# Patient Record
Sex: Female | Born: 1950 | Race: White | Hispanic: No | Marital: Married
Health system: Midwestern US, Academic
[De-identification: ages and names within clinical notes are randomized; demographics above are authoritative.]

---

## 2015-01-20 IMAGING — MG MAMMOGRAM 3D SCREEN BILATERAL
8 series · 8 of 8 positions shown · non-contrast
Comparison: [DATE] [DATE], [DATE], [DATE] [DATE], [DATE].
Left breast sonogram April 12, 2012.

DIGITAL MAMMOGRAM SCREENING
INDICATION: Screening.
TECHNIQUE: Four standard projections with CAD.
Four standard projections with tomosynthesis.

[R CC]
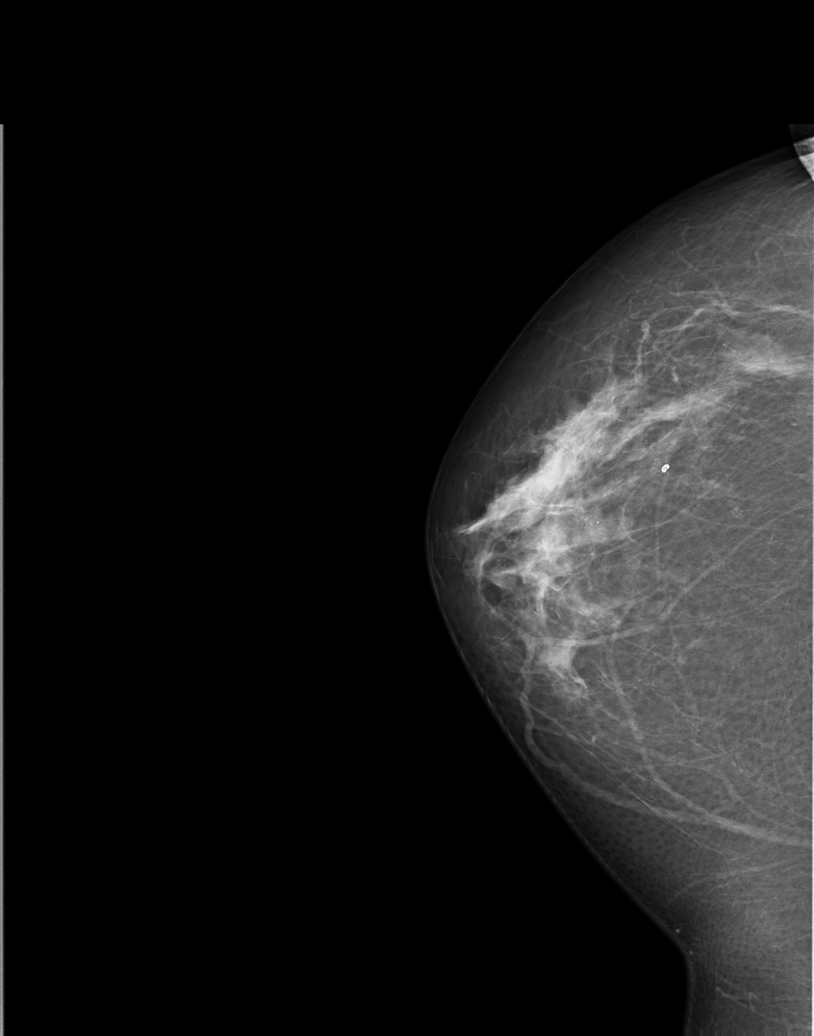

[R tomo (1 of 2)]
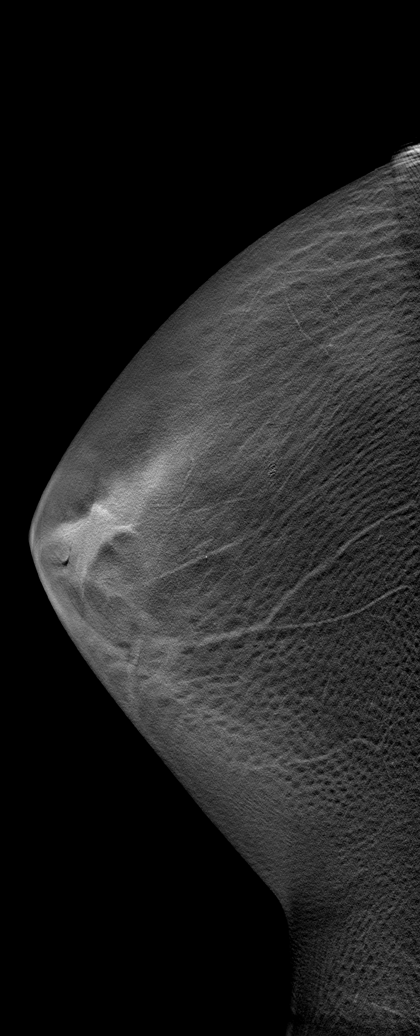

[L CC]
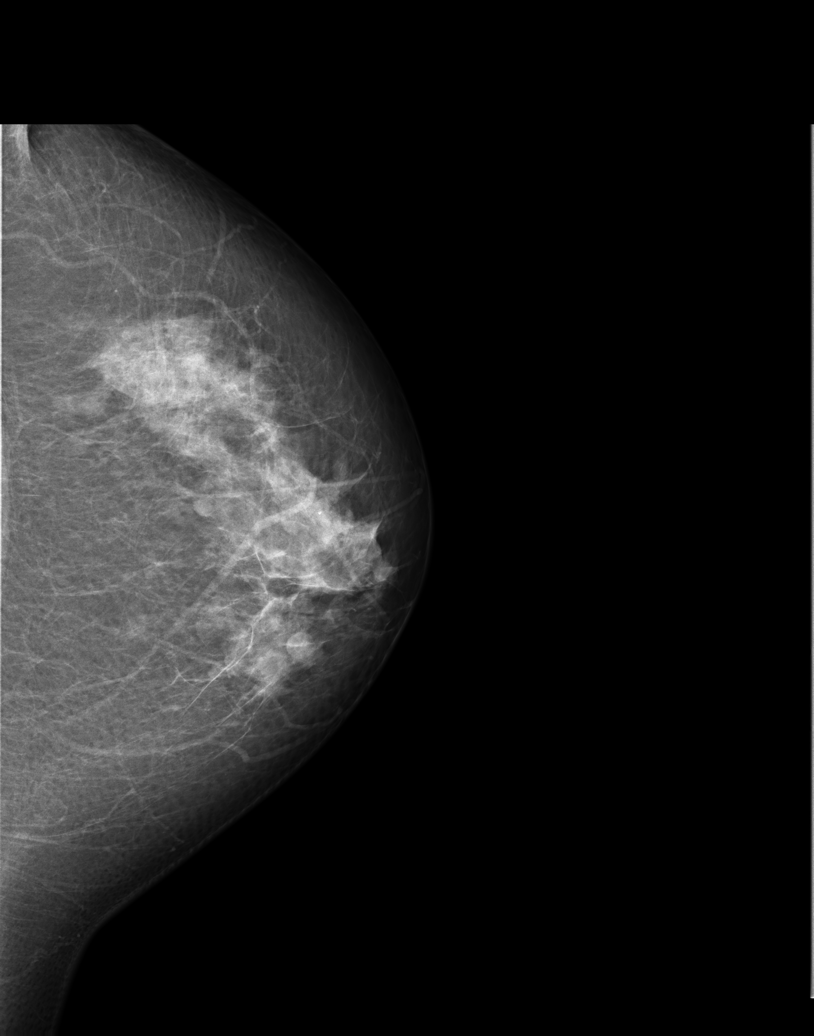

[L tomo (1 of 2)]
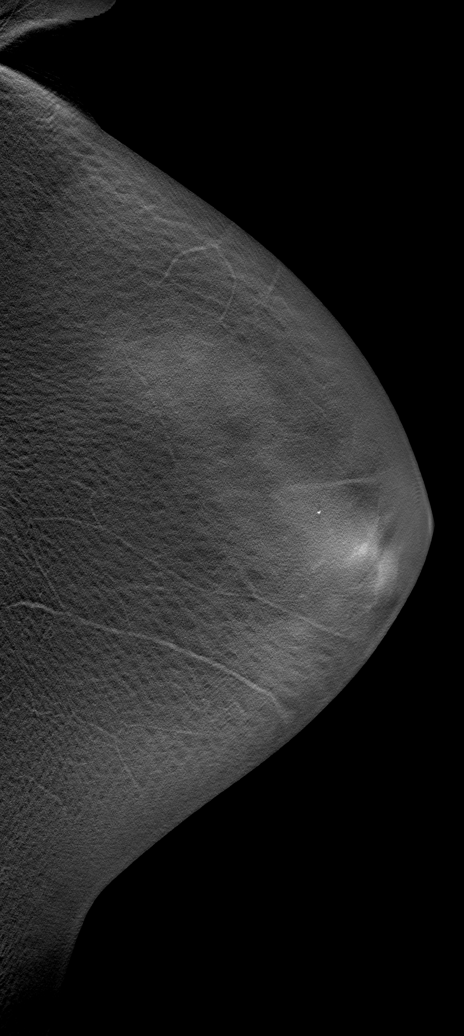

[R MLO]
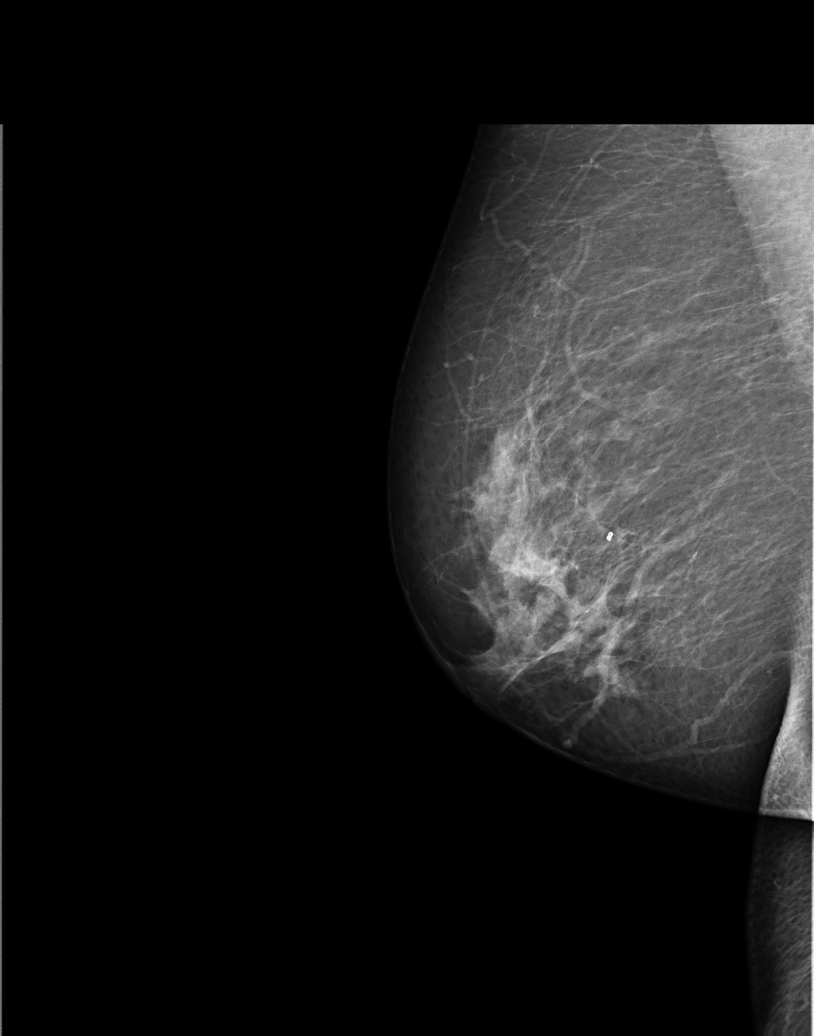

[R tomo (2 of 2)]
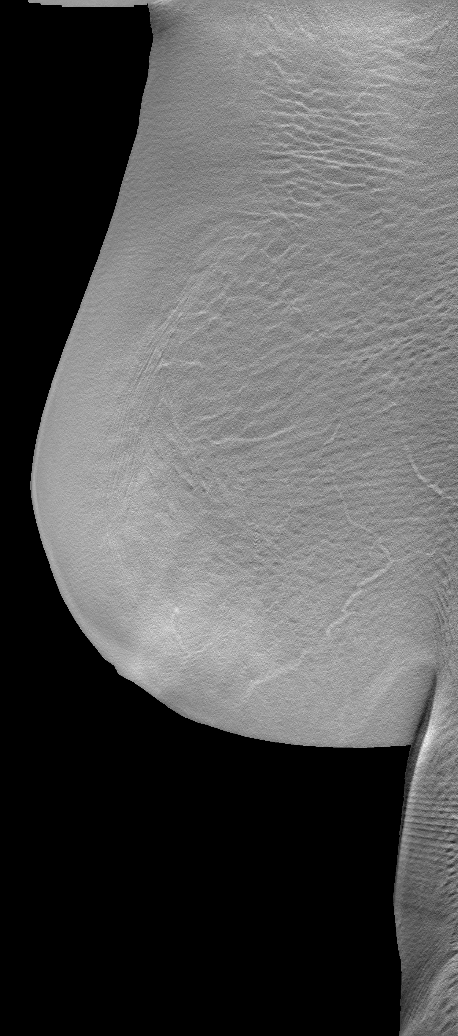

[L MLO]
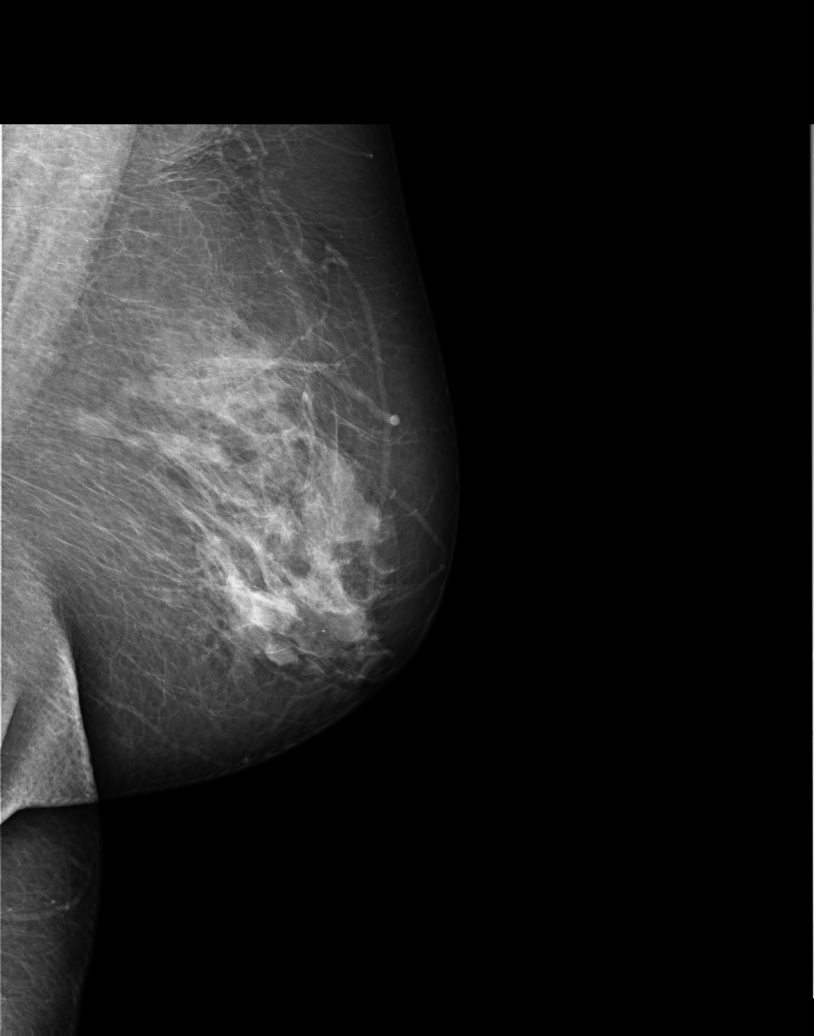

[L tomo (2 of 2)]
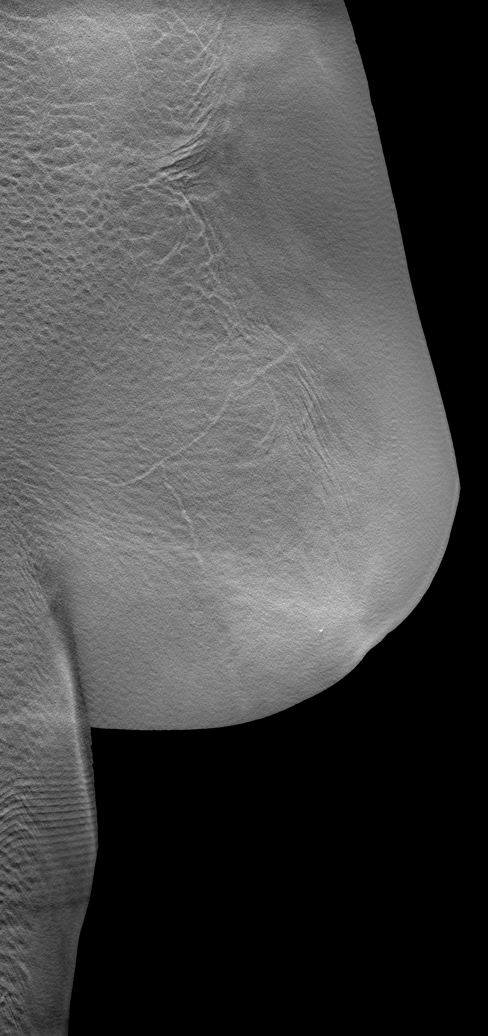

[8 of 8 positions shown; findings below may reference images not displayed]

FINDINGS: There is a stable underlying scattered fibroglandular pattern.
There are multiple well-circumscribed mass densities of the lower inner
quadrant of the left breast at anterior depth which for the most part are the
same or smaller than on the previous exams.
No new, dominant, nor suspicious parenchymal asymmetry, architectural
distortion or calcifications seen.
IMPRESSION: Left breast:  BIRADS 0:  Incomplete:  Need additional imaging evaluation.
Sonographic assessment is recommended.
The absence of a significant mammographic finding should not delay biopsy of
any clinically suspicious lesion.

Right breast:  BIRADS 1:  Negative mammogram.
Annual mammographic follow up is recommended.
Regular breast self-examination is encouraged.

Overall BIRADS category for this exam:  BIRADS 0:  Incomplete:  Need
additional imaging evaluation.

Exam discussed with Manha Achol at [DATE] a.m.

BI-RADS:  0-INCOMPLETE:  NEEDS ADDITIONAL IMAGING EVALUATION

FOLLOWUP:  IMMEDIATE FOLLOWUP

Tech Notes: SCREENING. HX RT CYST ASPIRATION. 2 MATERANL AUNTS WITH BREAST CANCER. LM

## 2017-01-11 IMAGING — CR CHEST
2 series · 2 of 2 positions shown · non-contrast
Comparison: none

[chest pa x-wise]
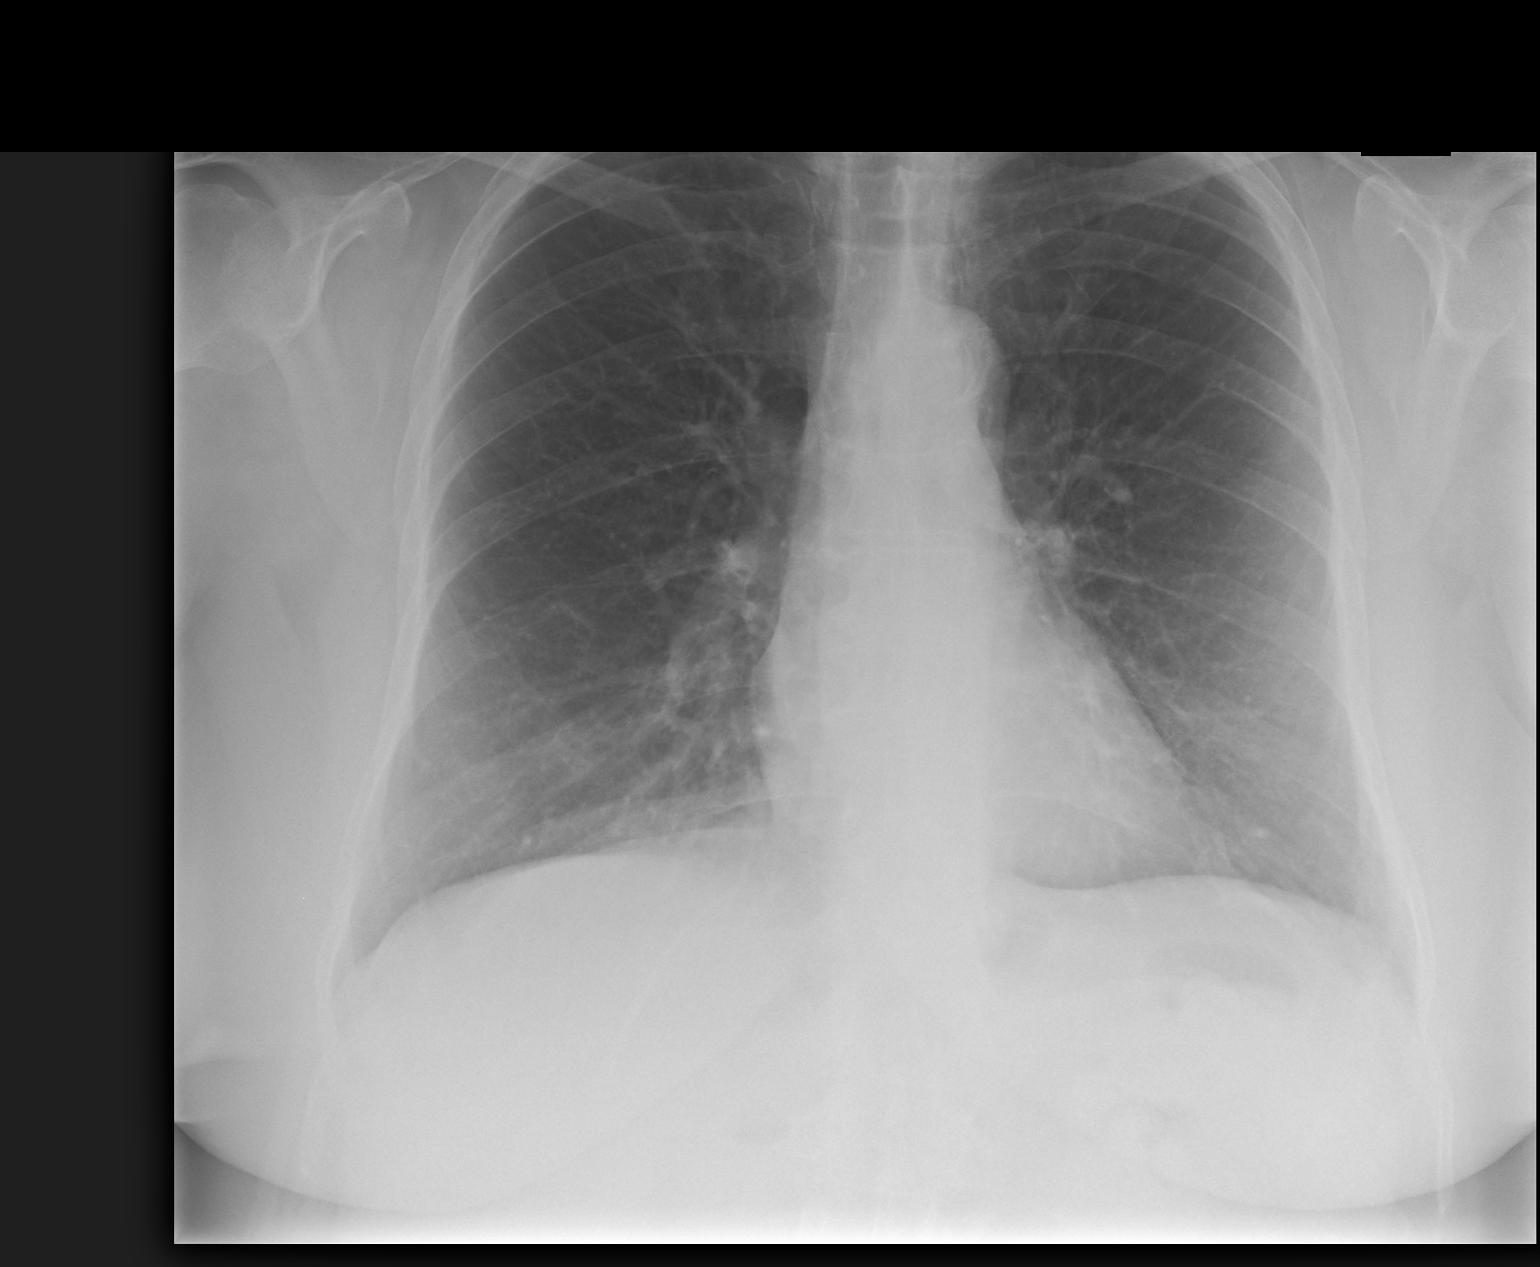

[chest lat]
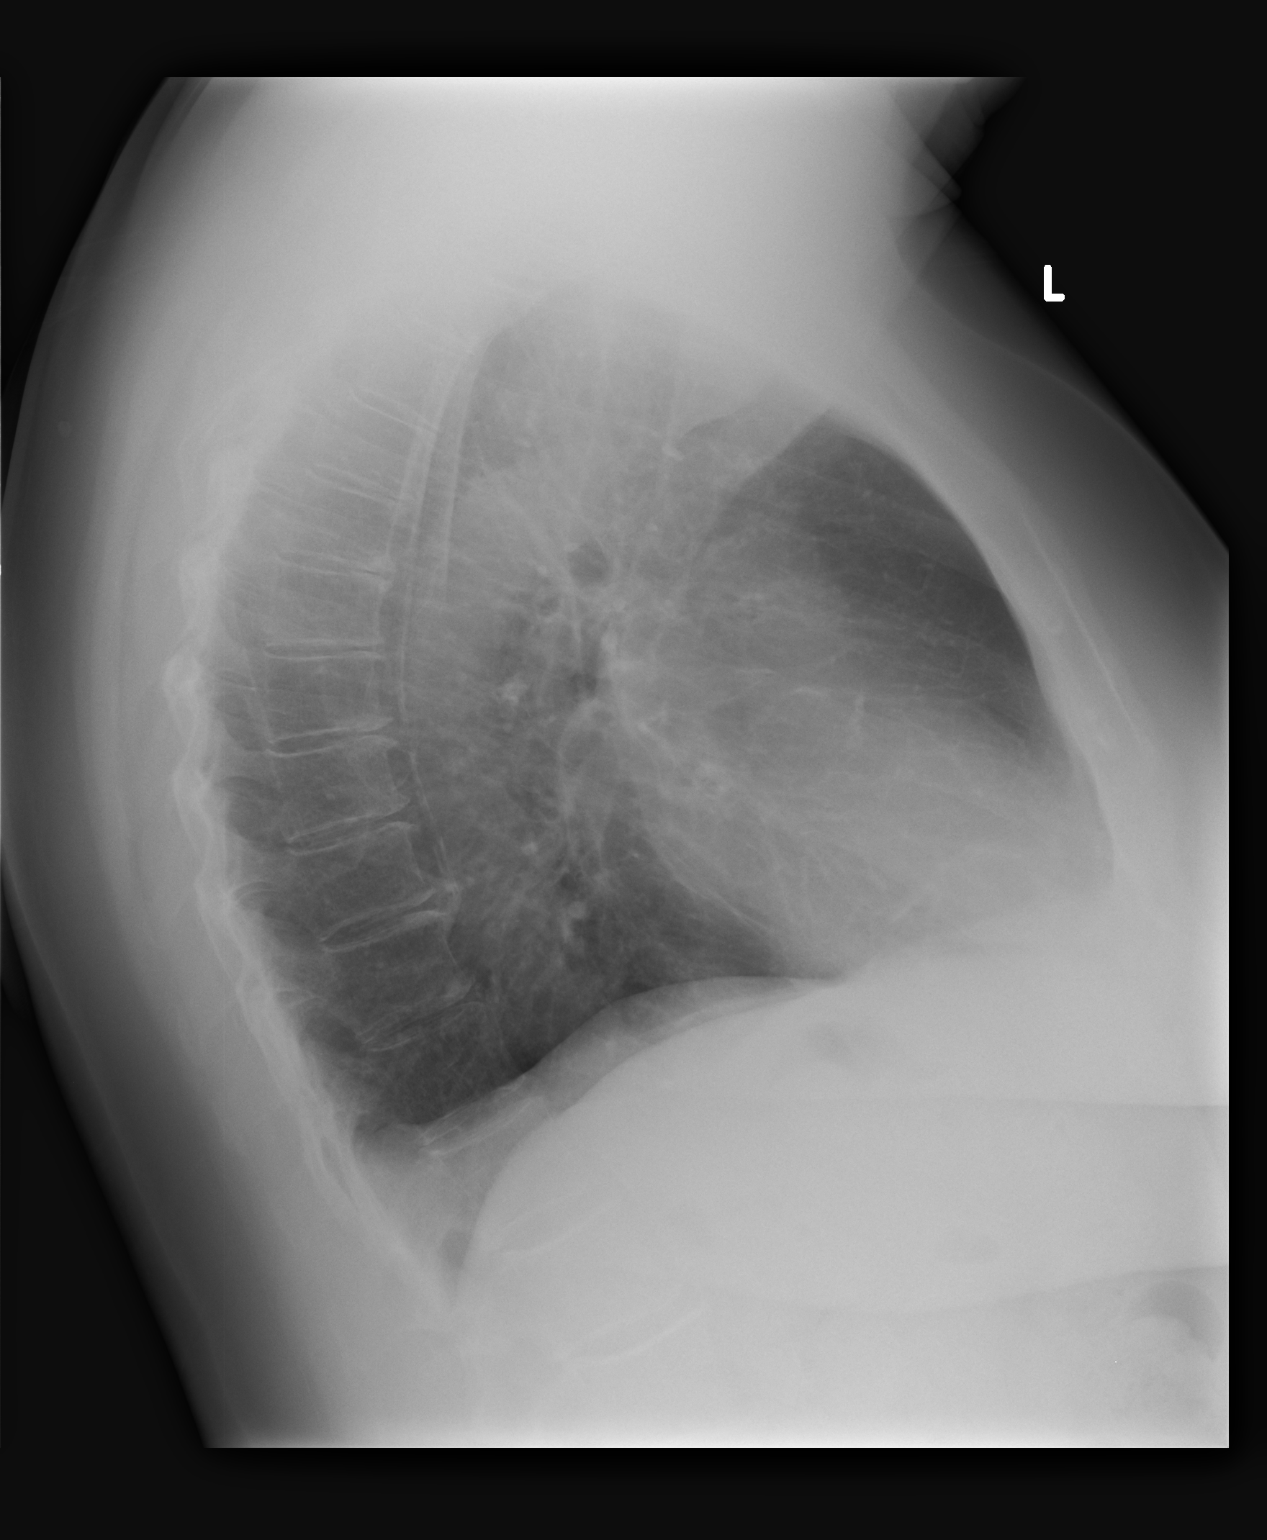

[2 of 2 positions shown; findings below may reference images not displayed]

DIAGNOSTIC STUDIES

EXAM

RADIOLOGICAL EXAMINATION, CHEST; SINGLE VIEW, FRONTAL CPT 96363

INDICATION

SOB WITH EXERTION, COPD, MILD, NO SMOKING FOR 20 YEARS
SOB WITH EXERTION. COPD. QUIT SMOKING 20 YEARS AGO. SB/TM

TECHNIQUE

Single AP portable view of the chest was performed.

COMPARISONS

No priors available for comparison.

FINDINGS

The heart appears normal in size. COPD. Mild bilateral hilar prominence. No dense consolidation.
Mild peribronchial thickening. No acute fracture.

IMPRESSION

COPD with mild peribronchial thickening. No dense consolidation.

## 2017-01-25 IMAGING — MG MAMMOGRAM 3D SCREEN, BILATERAL
12 of 16 series · 12 of 16 positions shown · non-contrast
Comparison: none

[R CC (1 of 2)]
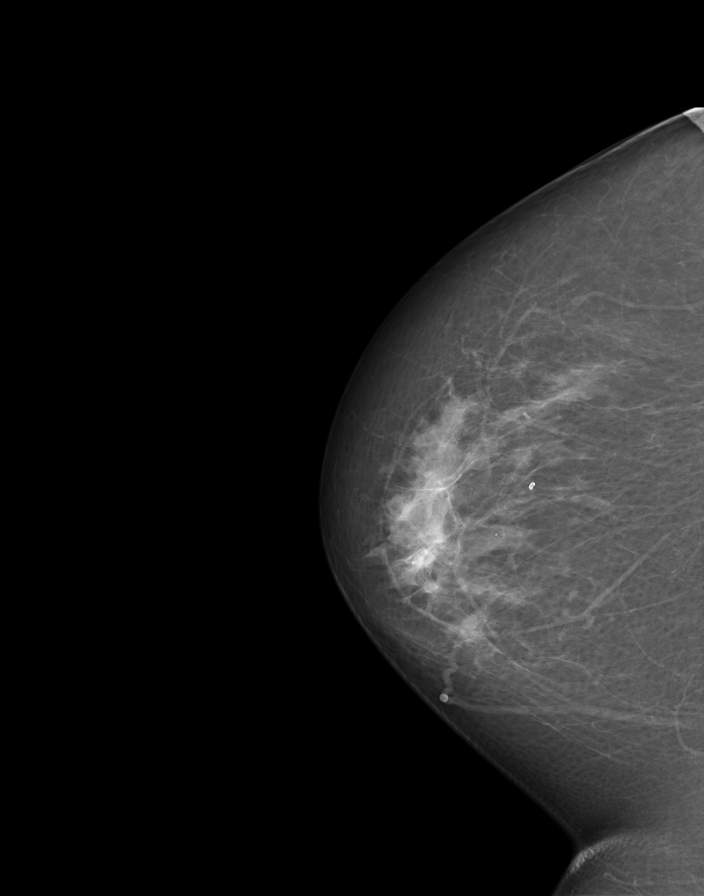

[R tomo (1 of 2)]
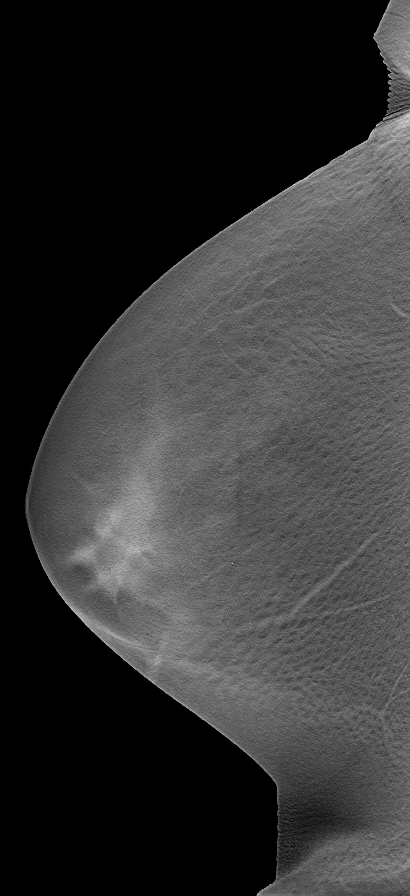

[R CC (2 of 2)]
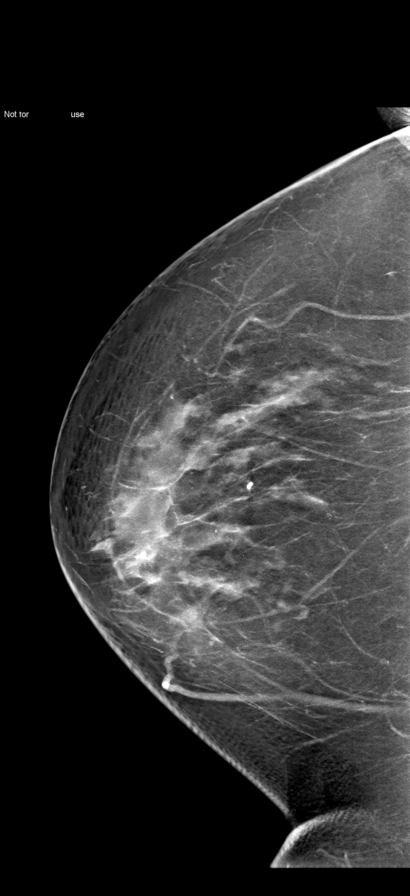

[R (1 of 2)]
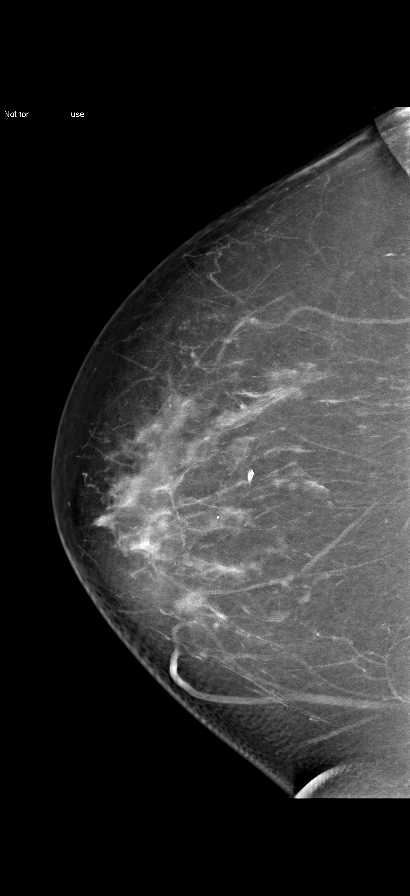

[L CC (1 of 2)]
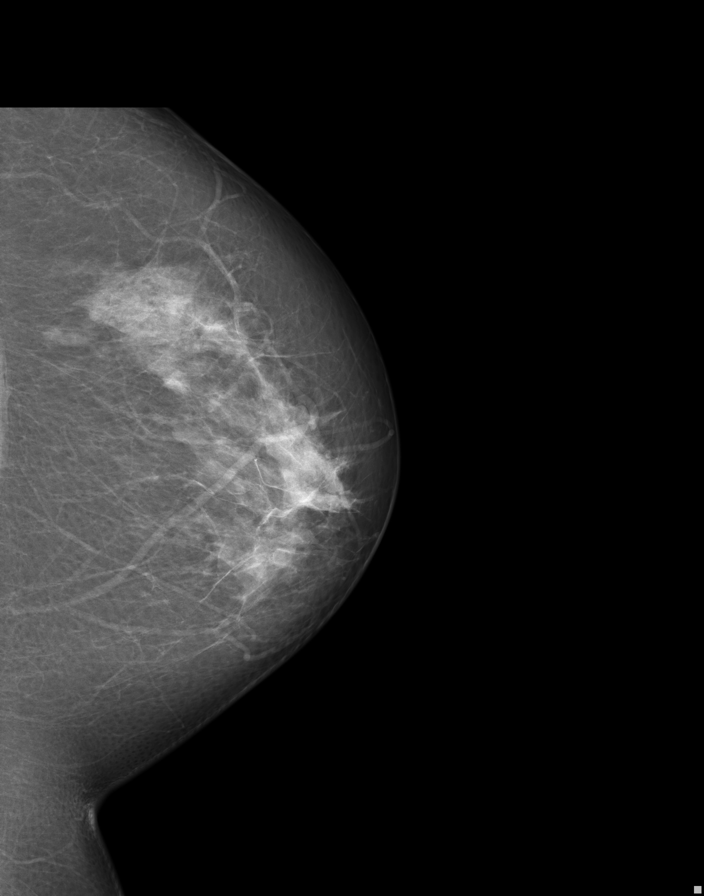

[L tomo]
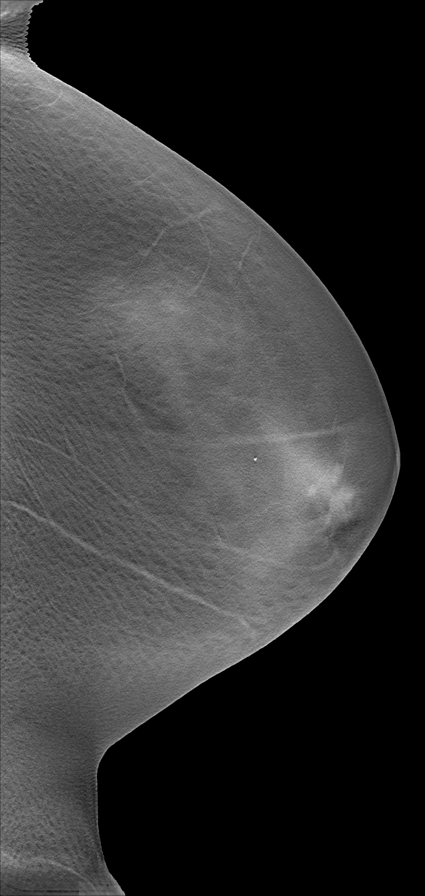

[L CC (2 of 2)]
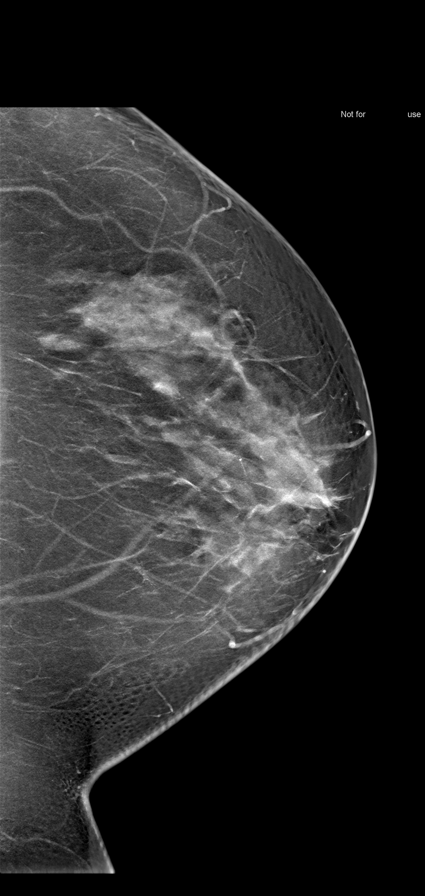

[L]
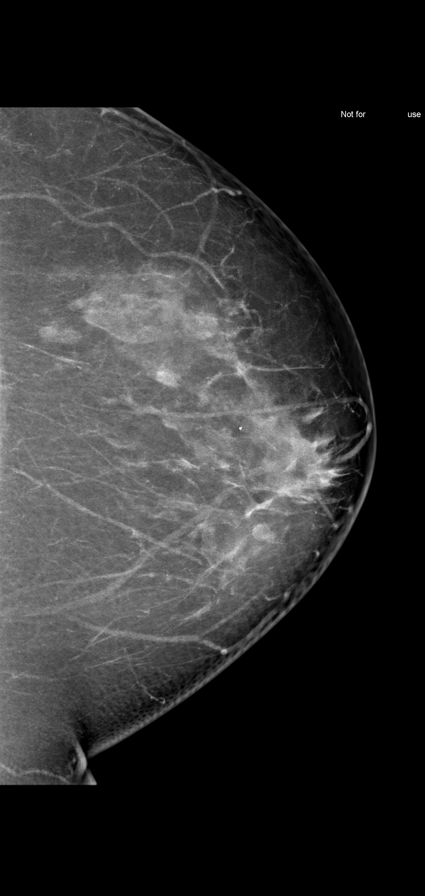

[R MLO (1 of 2)]
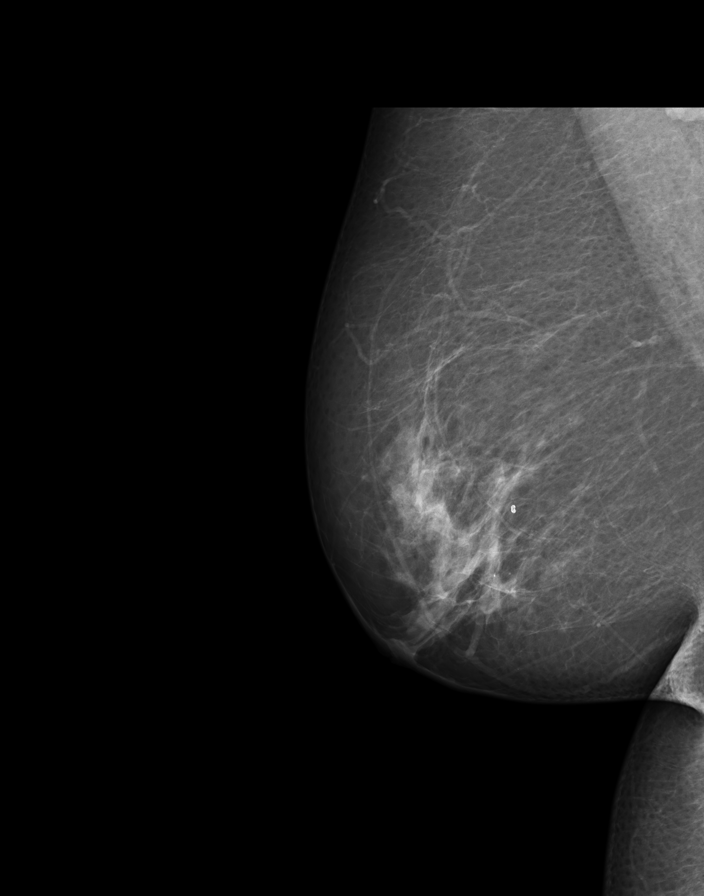

[R tomo (2 of 2)]
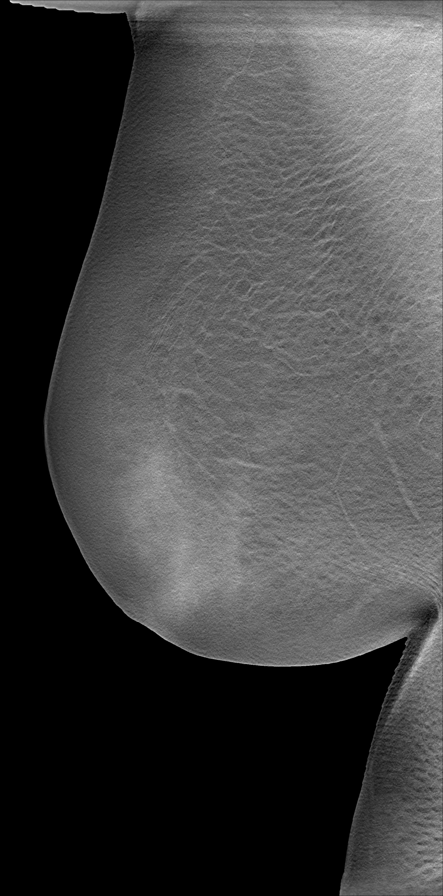

[R MLO (2 of 2)]
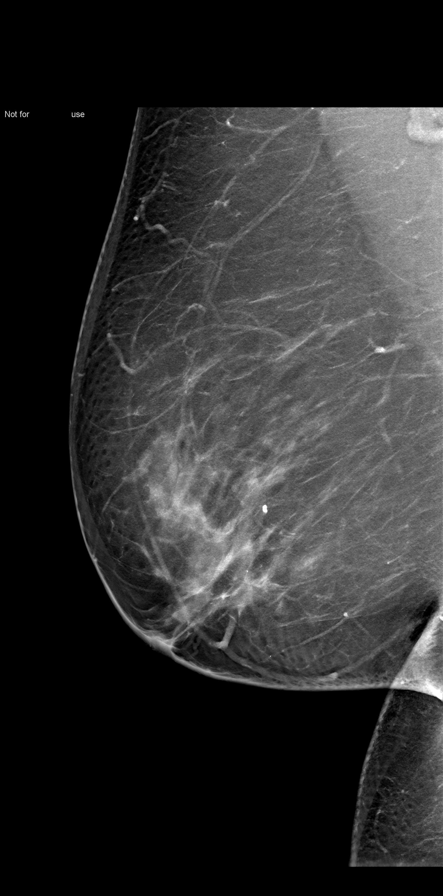

[R (2 of 2)]
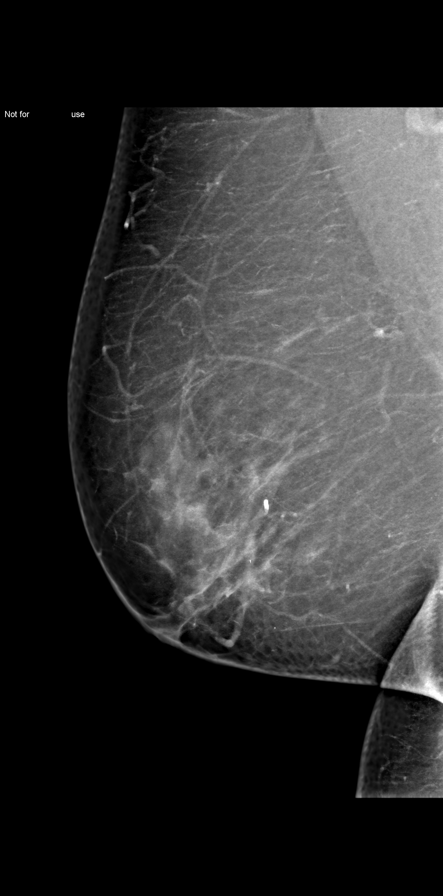

[12 of 16 positions shown; findings below may reference images not displayed]

EXAM

3D SCREENING MAMMOGRAM, BILATERAL

INDICATION

SCREENING
CYST ASPIRATION ON RT BREAST 1117. 2 MATERNAL AUNTS DX IN EARLY 50s - 70s.
SCREENING. AB (3D) PRIORS: [id number], [id number].

TECHNIQUE

Digital 2D CC and MLO projections obtained with 3D tomographic views per manufacturer's protocol.
ICAD version 7.2 was used during this exam.

COMPARISONS

Screening mammogram January 25, 2016 and January 20, 2015

FINDINGS

Scattered fibroglandular densities are evident both breasts. The breasts are symmetric in size and
density. Benign calcifications are present. No new suspicious calcification, mass, asymmetric
density, or architectural distortion.

IMPRESSION

BI-RADS 2, BENIGN.

Continued annual screening mammography is recommended in follow-up.

## 2018-03-19 IMAGING — MG MAMMOGRAM 3D SCREEN, BILATERAL
12 of 17 series · 12 of 17 positions shown · non-contrast
Comparison: none

[R CC (1 of 2)]
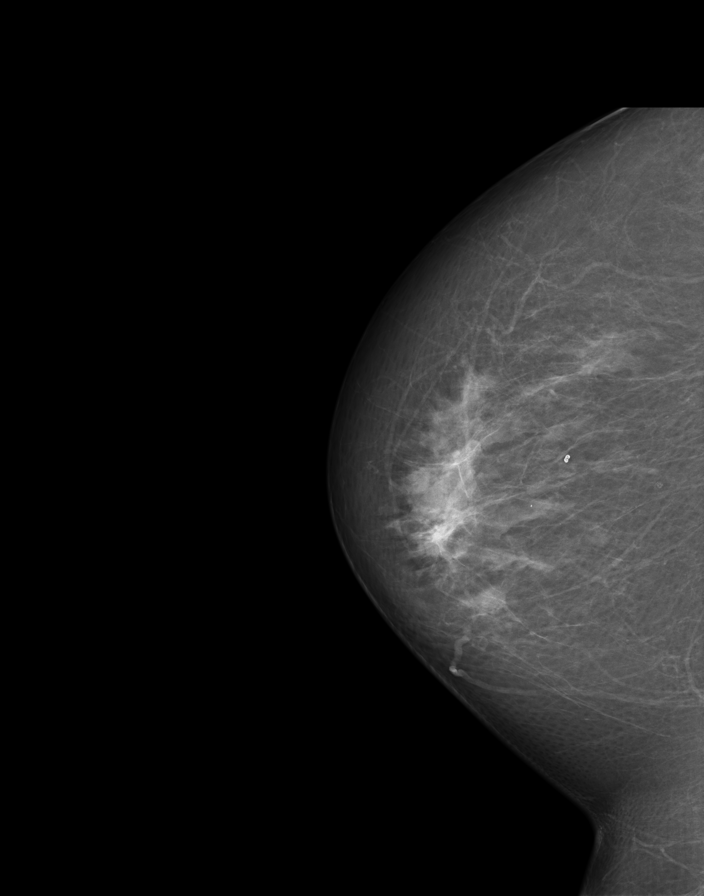

[R tomo (1 of 2)]
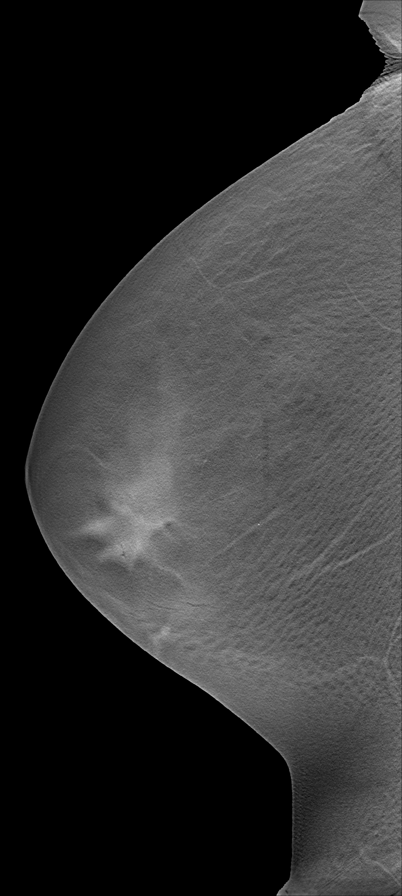

[R CC (2 of 2)]
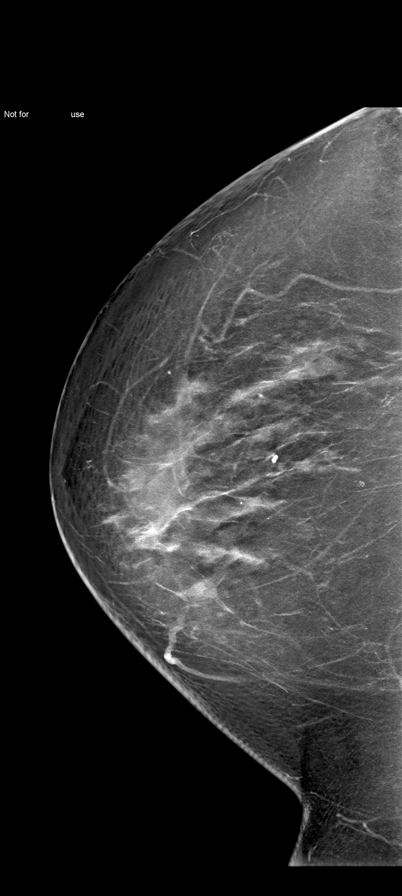

[R (1 of 2)]
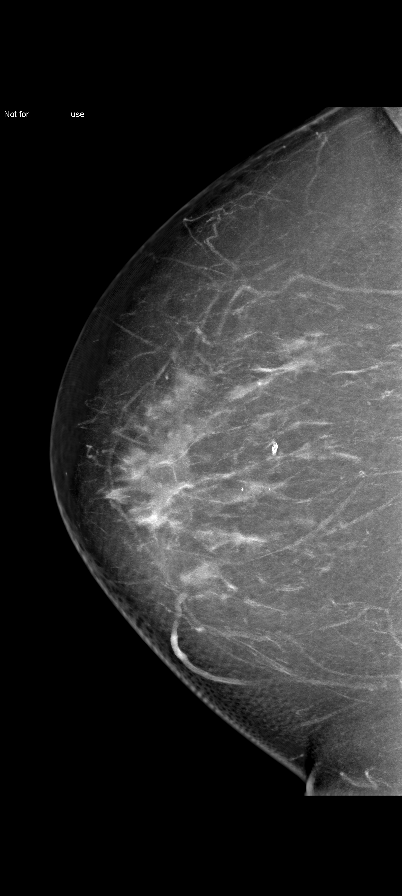

[L CC (1 of 2)]
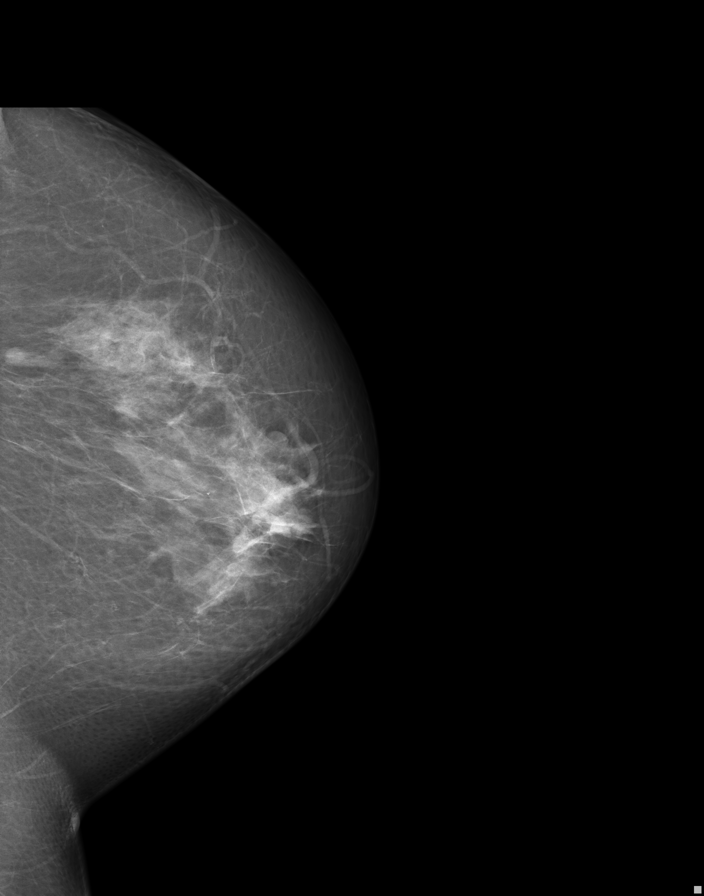

[L tomo]
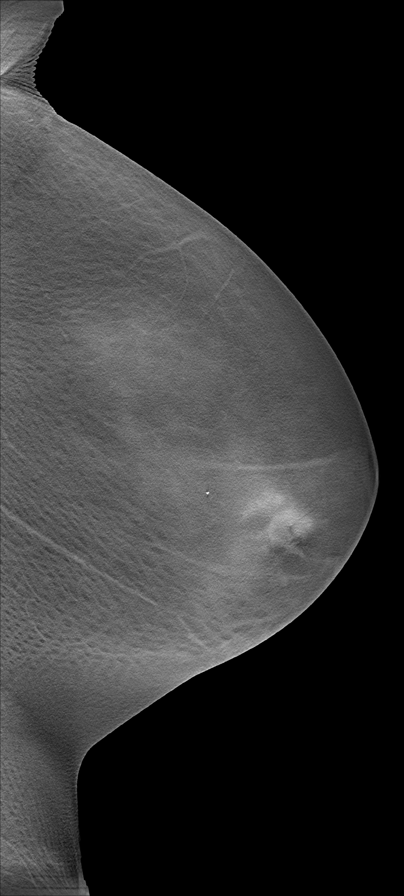

[L CC (2 of 2)]
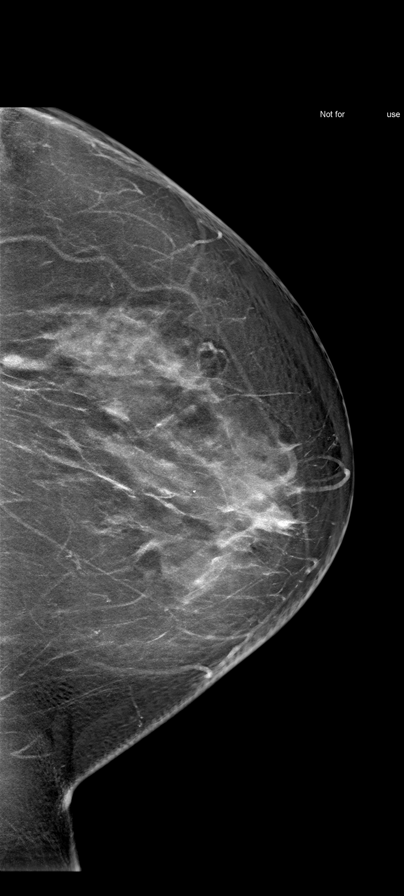

[L]
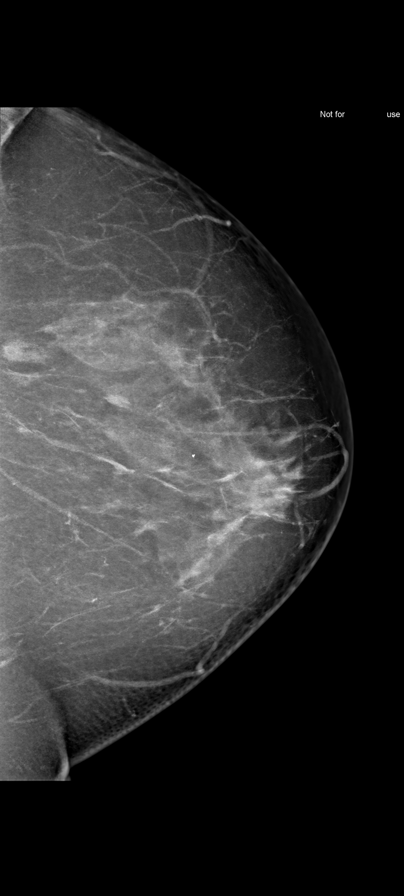

[R MLO (1 of 2)]
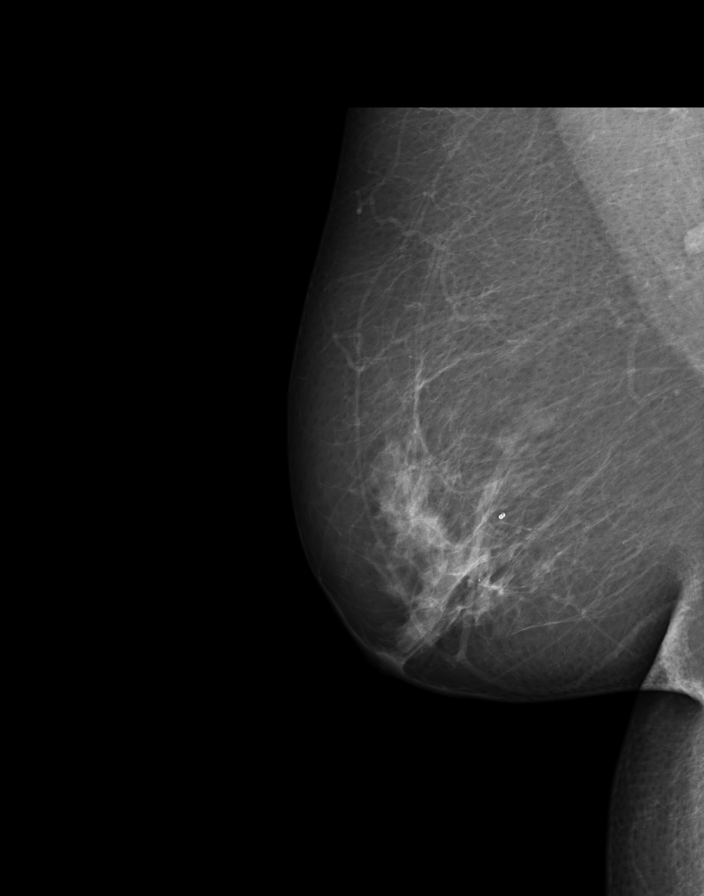

[R tomo (2 of 2)]
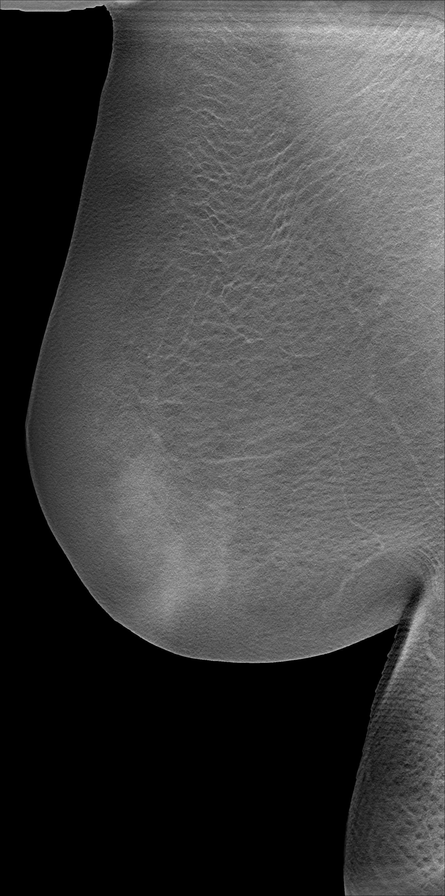

[R MLO (2 of 2)]
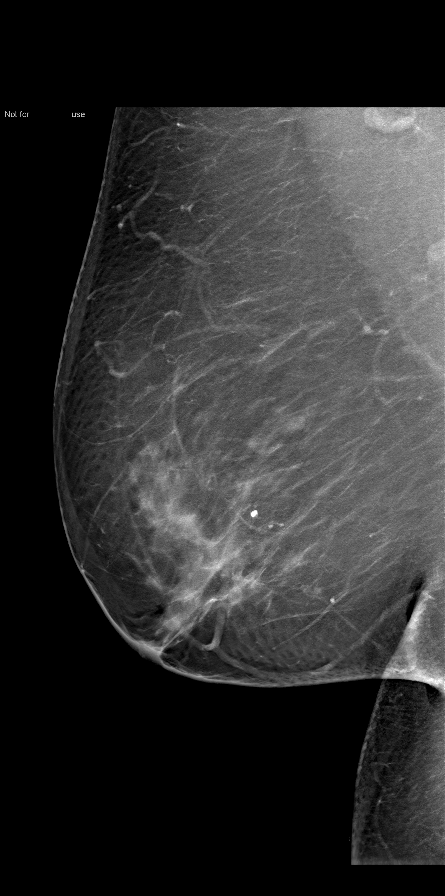

[R (2 of 2)]
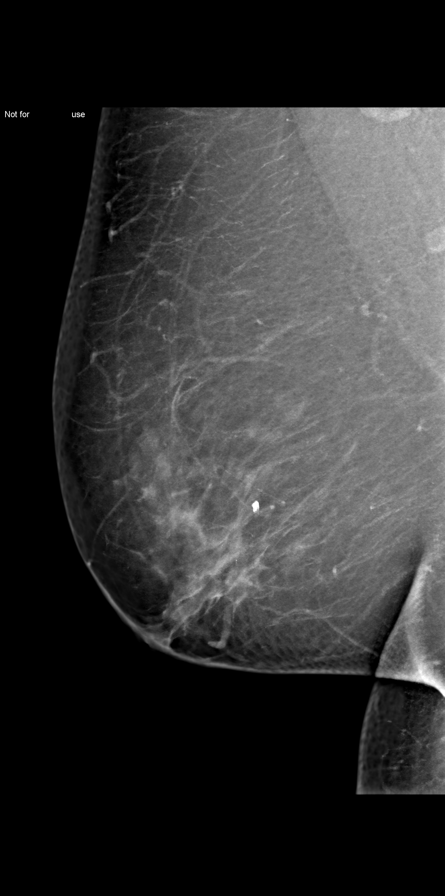

[12 of 17 positions shown; findings below may reference images not displayed]

EXAM
3D SCREENING MAMMOGRAM, BILATERAL

INDICATION
screening
CYST ASPIRATION 10+ YRS AGO. 2 MATERNAL AUNTS DX: 1 YOUNG AND 1 IN HER 70s. SCREEN. AB (3D) PRIORS:
[id number], [id number].

TECHNIQUE
Digital 2D CC and MLO projections obtained with 3D tomographic views per manufacturer's protocol.
ICAD version 7.2 was used during this exam.

COMPARISONS
January 25, 2017.

FINDINGS
ACR Type 2: 25-50% There are scattered fibroglandular densities.
No concerning masses, calcifications, or interval changes are seen.

IMPRESSION
Stable bilateral mammography. One year follow-up is recommended.
BI-RADS 2, BENIGN.

Tech Notes:

## 2019-07-22 IMAGING — MG MAMMOGRAM 3D SCREEN, BILATERAL
12 of 17 series · 12 of 17 positions shown · non-contrast
Comparison: none

[R CC (1 of 2)]
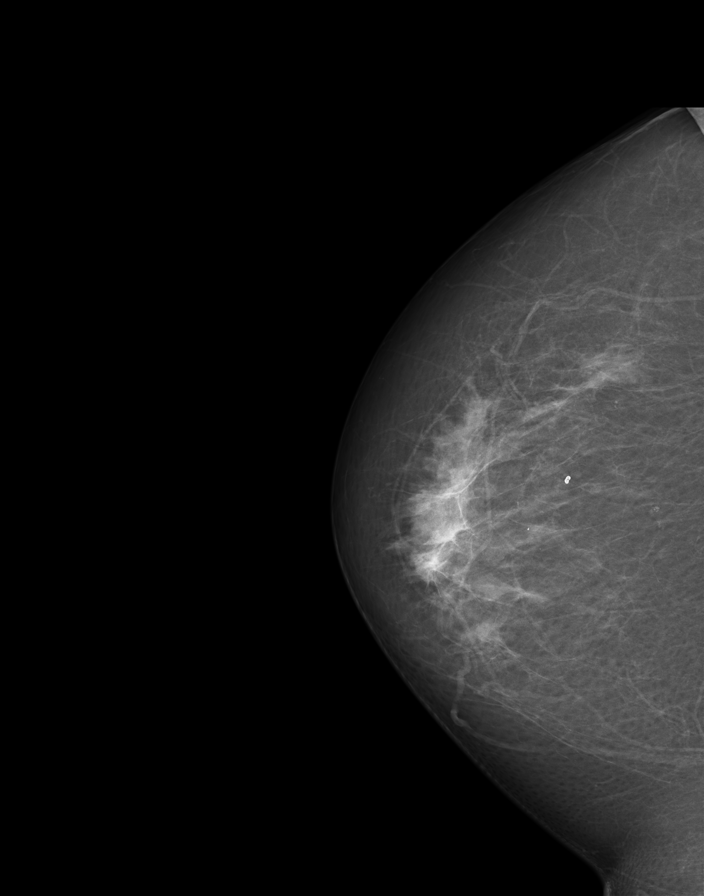

[R tomo (1 of 2)]
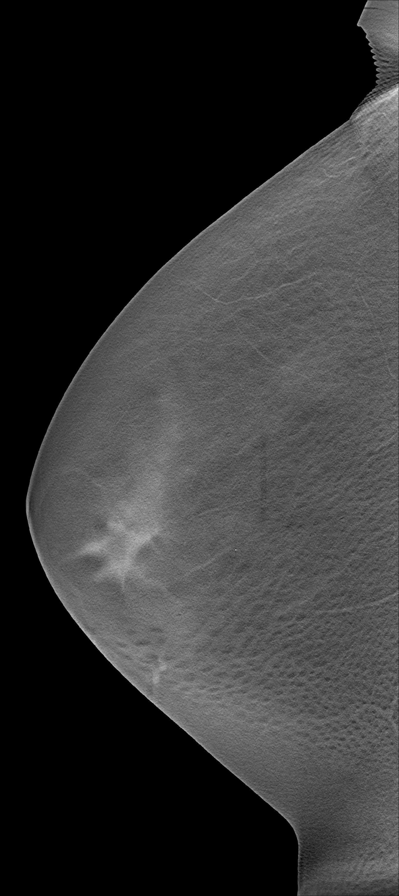

[R CC (2 of 2)]
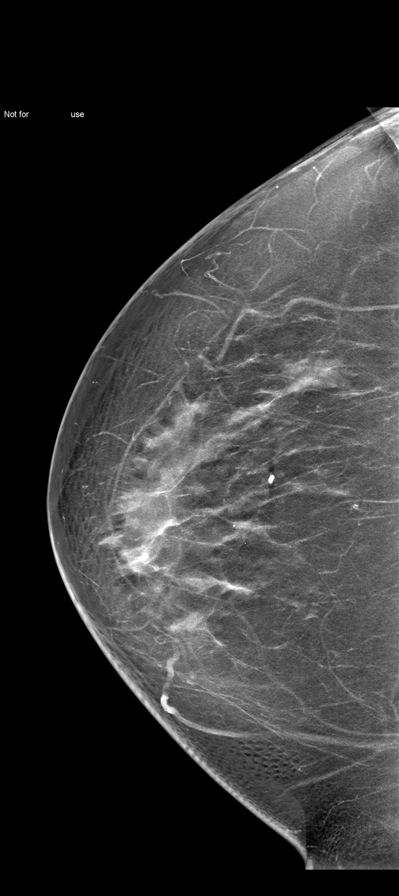

[R]
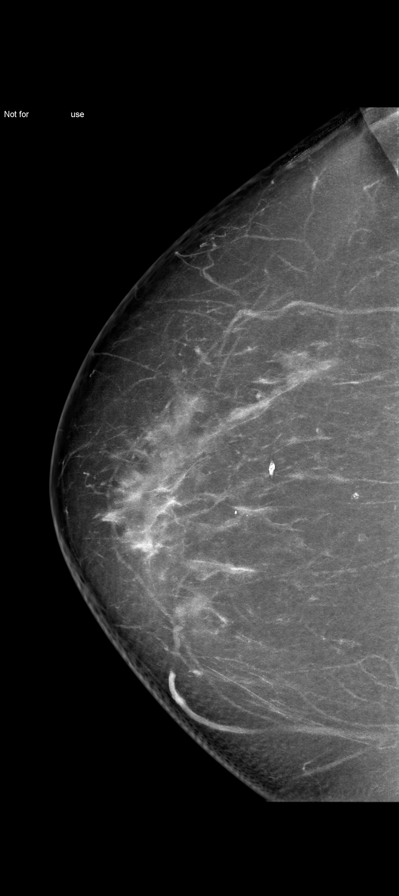

[L CC (1 of 2)]
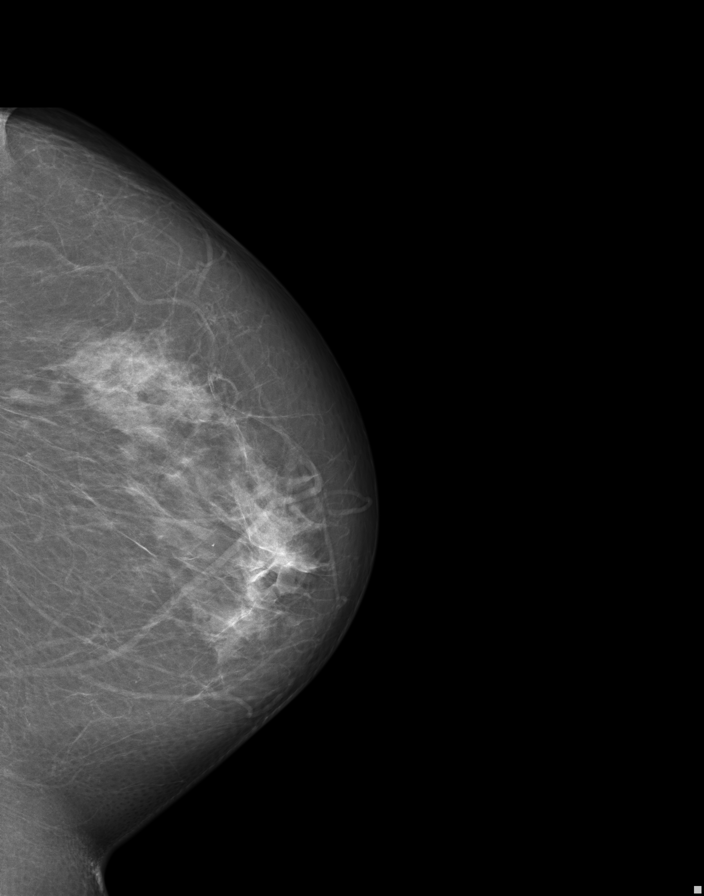

[L tomo]
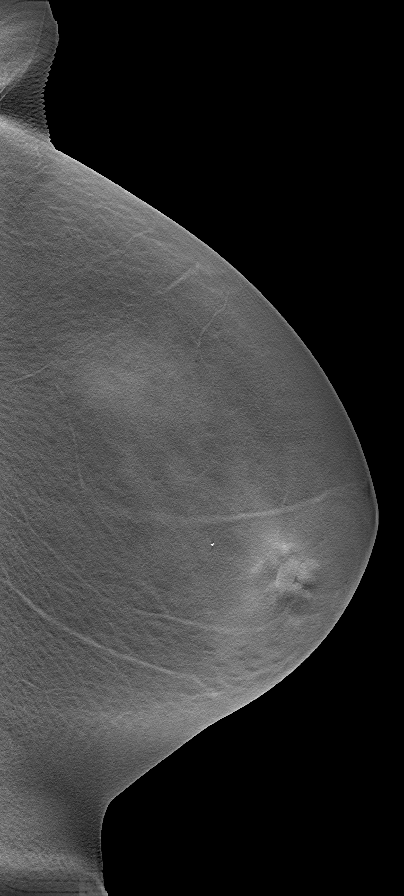

[L CC (2 of 2)]
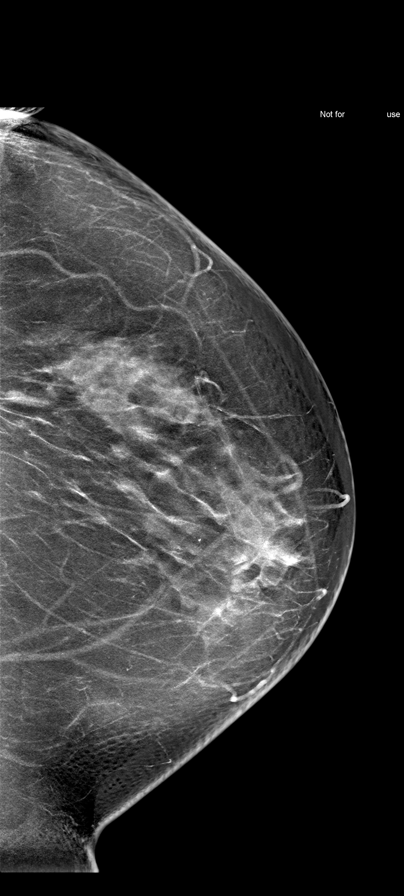

[L]
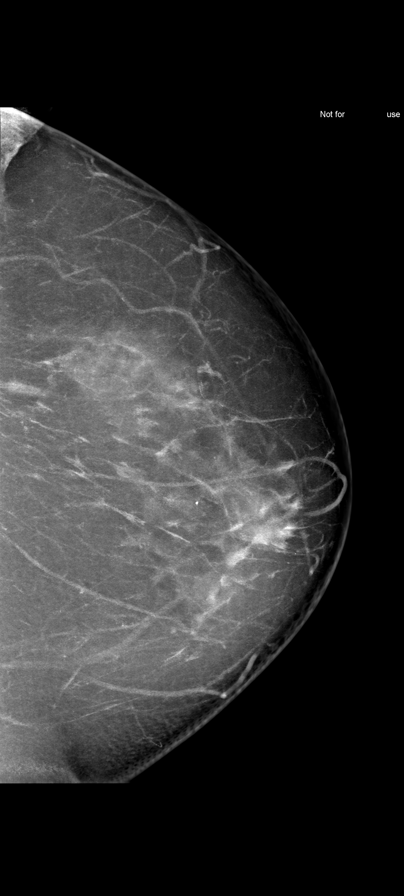

[R MLO (1 of 3)]
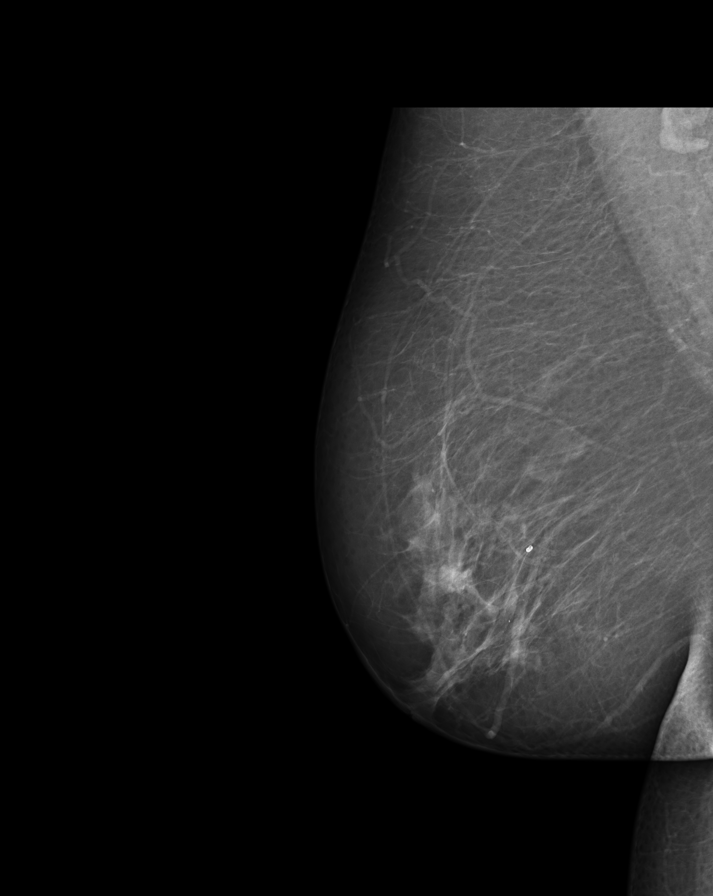

[R MLO (2 of 3)]
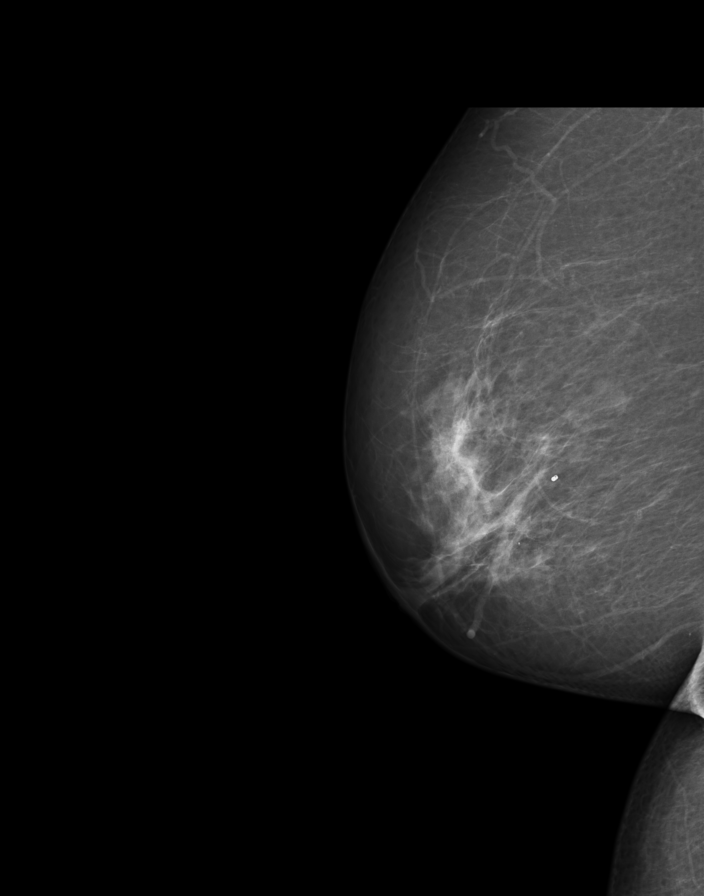

[R tomo (2 of 2)]
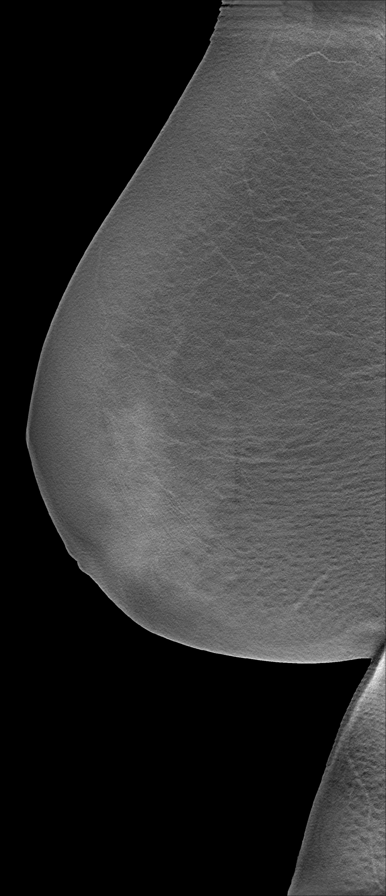

[R MLO (3 of 3)]
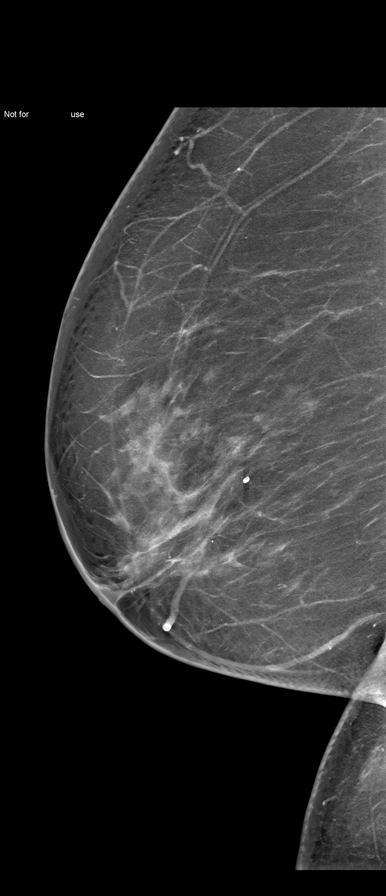

[12 of 17 positions shown; findings below may reference images not displayed]

EXAM

3D SCREENING MAMMOGRAM, BILATERAL

INDICATION

screening
CYST ASPIRATION @ 10+ YRS AGO.  2 MAT AUNTS DX- ONE YOUNG AND THE OTHER IN HER 70s.  SCREENING.  AB
(3D) PRIORS: [id number], [id number].

TECHNIQUE

Digital 2D CC and MLO projections obtained with 3D tomographic views per manufacturer's protocol.
ICAD version 7.2 was used during this exam.

COMPARISONS

March 19, 2018

FINDINGS

ACR Type 2:  25-50% There are scattered fibroglandular densities.

Breasts are stable and unchanged. No concerning masses or calcifications are seen.

IMPRESSION

Stable mammograms. One year follow-up is recommended.

BI-RADS 2, BENIGN.

Tech Notes:

## 2019-11-15 IMAGING — NM NM HIDA
1 series · 6 of 6 positions shown · non-contrast
Comparison: none

[flow · 4.52mm/px · 6 of 49 frames shown]
[frame 5/49]
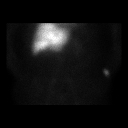
[frame 13/49]
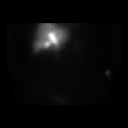
[frame 21/49]
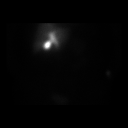
[frame 29/49]
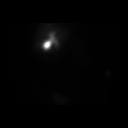
[frame 37/49]
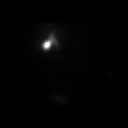
[frame 45/49]
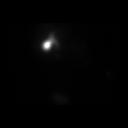

[6 of 6 positions shown; findings below may reference images not displayed]

EXAM

Nuclear medicine hepatobiliary scan

INDICATION

RUQ abdominal pain
5.4 mCi KcEEm Mebrofenin @ 2164. Pt given 8 oz Ensure and imaged 30 minutes post prandial. pt c/o
GERD and diarrhea/constipation. ME

FINDINGS

The patient received an IV dose of 5.4 millicuries of technetium 99 M mebrofenin. Abdominal imaging
was performed up to 1 hour.

Activity is identified in the gallbladder within 15 minutes. There is homogeneous activity
throughout the liver. There is progression of activity into the small bowel.

The patient subsequently received an oral dose of 8 ounces of Ensure. A gallbladder ejection
fraction of 85 percent was measured.

IMPRESSION

There is normal appearance of activity in the gallbladder within 15 minutes. The gallbladder
ejection fraction is measured at 85 percent. There is normal progression of activity into the small
bowel.

Tech Notes:

## 2019-11-27 IMAGING — RF FL esophagus
2 series · 13 of 13 positions shown · non-contrast
Comparison: none

[Series 1: esophogr 1 · 5 acquisitions, 6 frames shown]
[im 1/5]
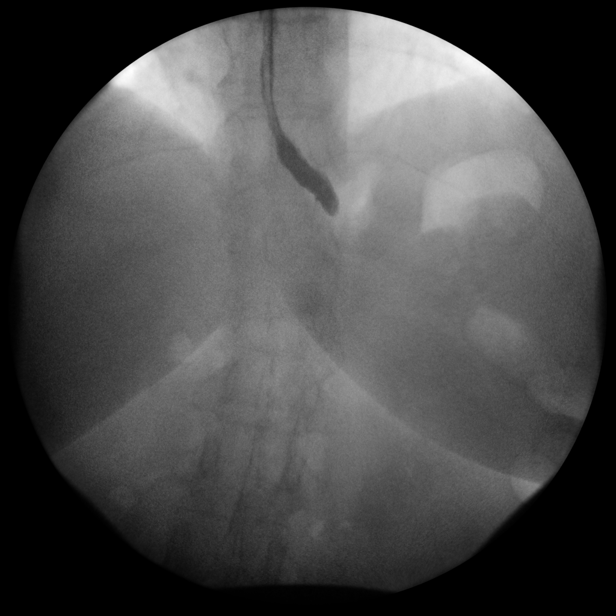
[im 2/5]
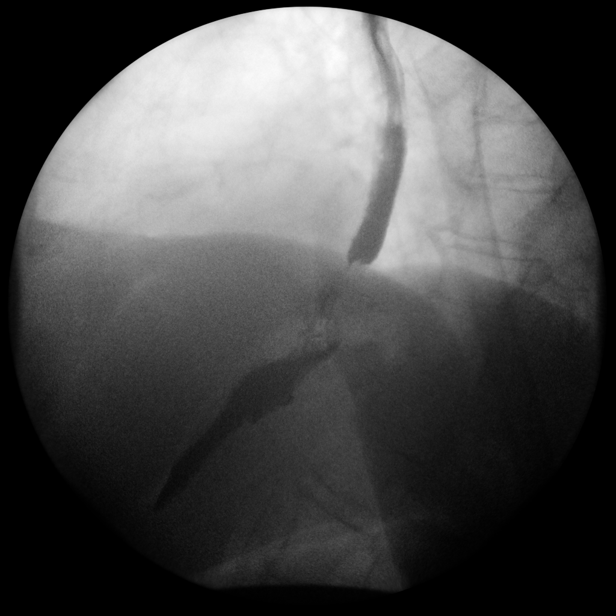
[im 3/5]
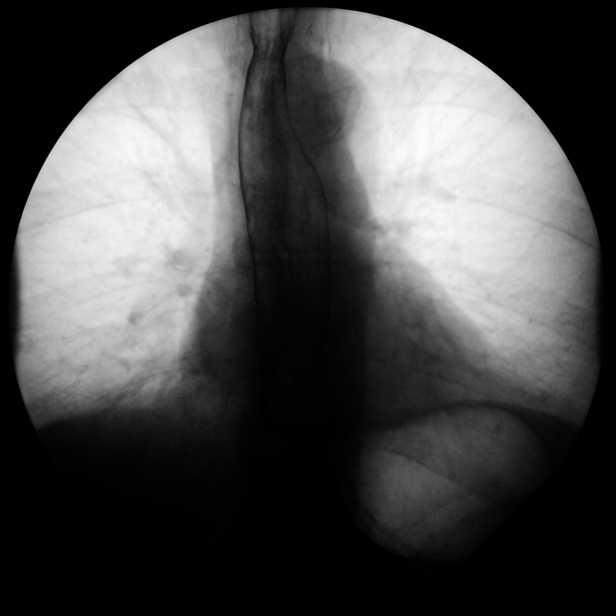
[im 4/5]
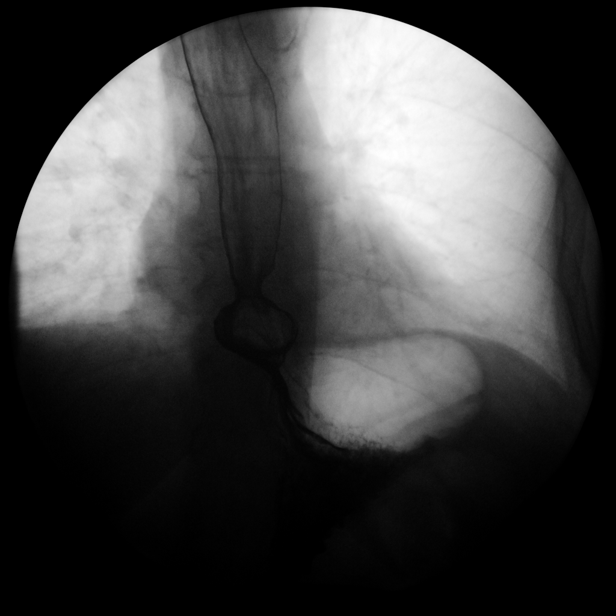
[im 5/5]
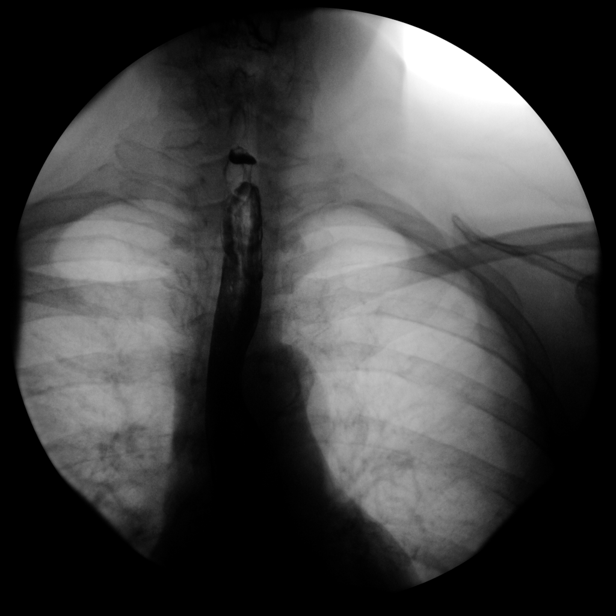
[im 5/5]
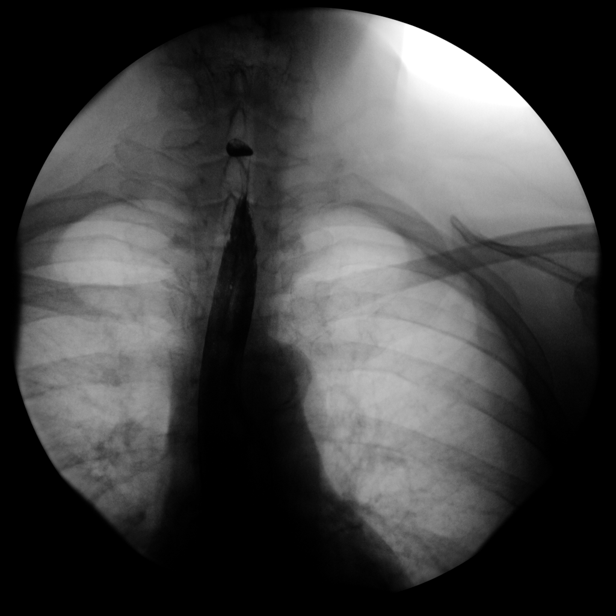

[Series 2: esophogr 2 · 7 of 7 slices shown]
[im 1/7]
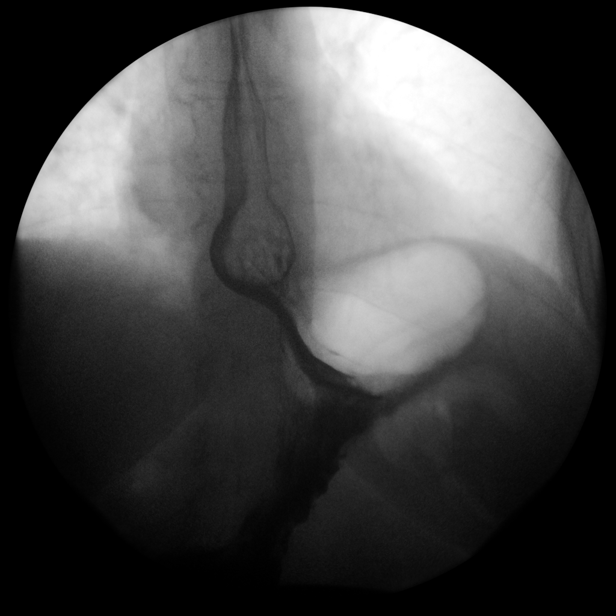
[im 2/7]
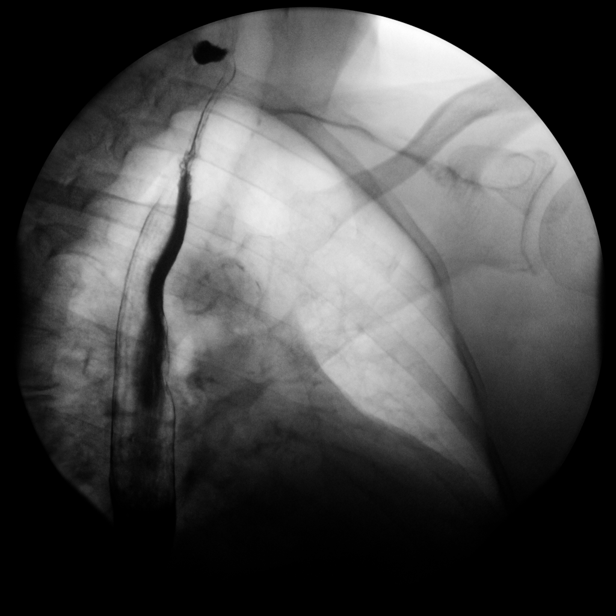
[im 3/7]
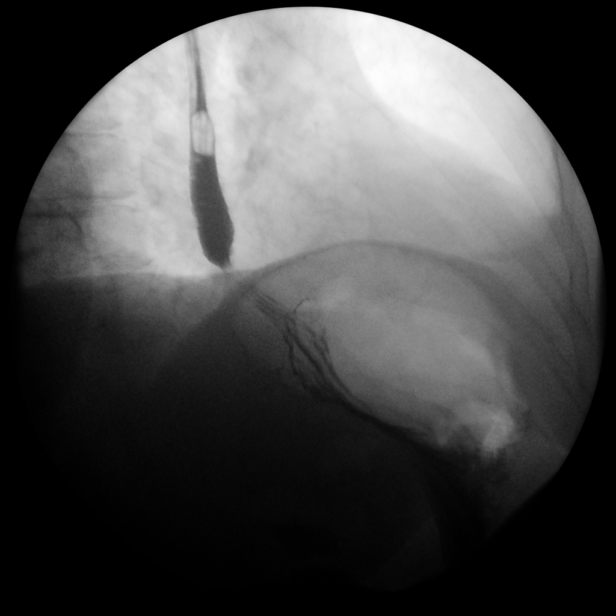
[im 4/7]
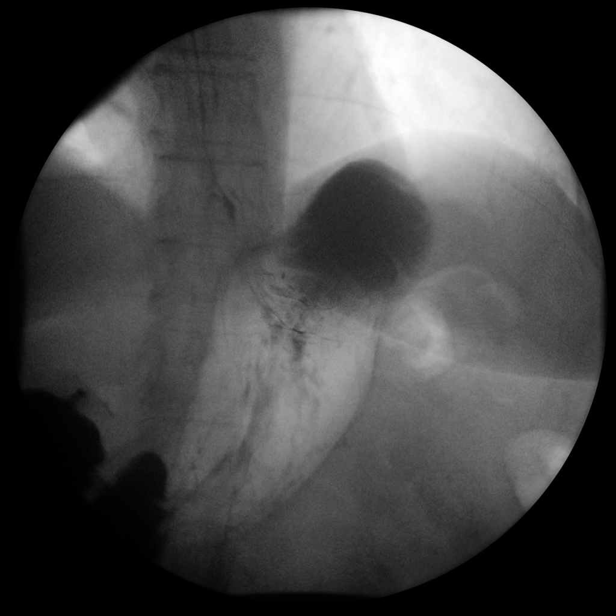
[im 5/7]
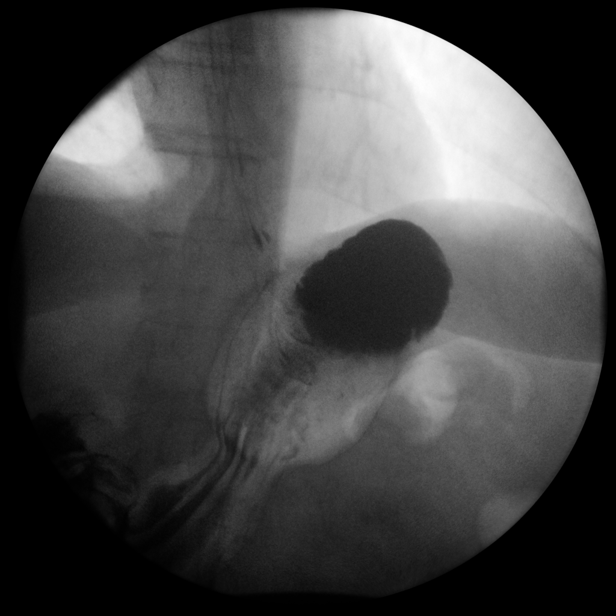
[im 6/7]
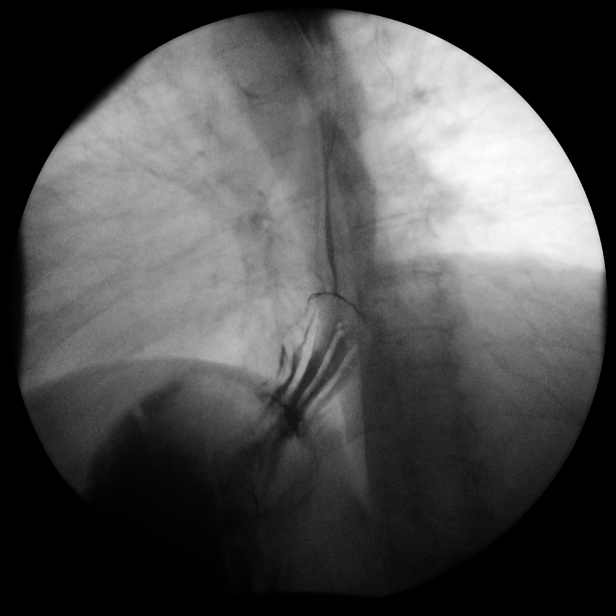
[im 7/7]
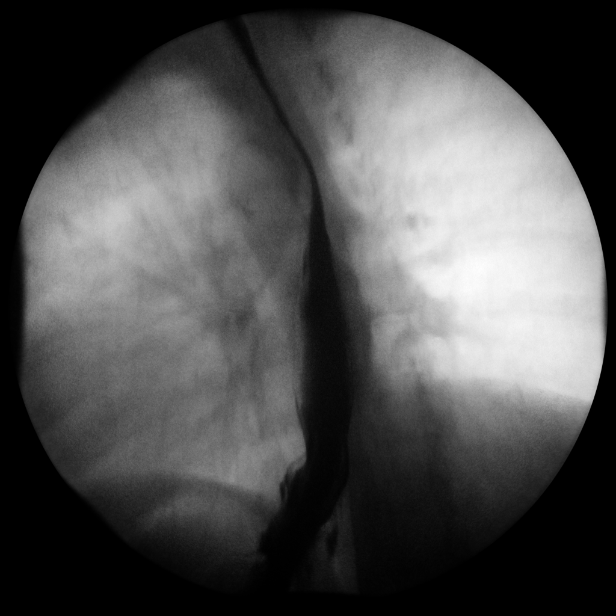

[13 of 13 positions shown; findings below may reference images not displayed]

DIAGNOSTIC STUDIES

EXAM

Double-contrast barium esophagram

INDICATION

GERD
13 images. Total fluoro time: 198 sec. Dose: 86.4 mGy. Pt c/o GERD. ME

TECHNIQUE

Double-contrast esophagram

COMPARISONS

None

FINDINGS

Eric Oriel Moutran diverticulum noted. Small hiatal hernia. Normal appearing esophageal mucosa without
stricture or obstructive mass identified. No reflux was elicited with provocative maneuvers.
Multiple tertiary contractions noted.

IMPRESSION

Small hiatal hernia.

Eric Oriel Moutran diverticulum.

Multiple tertiary contractions.

Tech Notes:

13 images. Total fluoro time: 198 sec. Dose: 86.4 mGy. Pt c/o GERD. ME

## 2020-07-30 IMAGING — CT BRAIN WO(Adult)
3 of 4 series · 14 of 47 positions shown, 16 images · non-contrast
Comparison: none

[Series 4: brain cor 5.00 hr40 s3 · coronal · 0.31mm/px · 3 of 34 slices shown]
[im 12/34  brain]
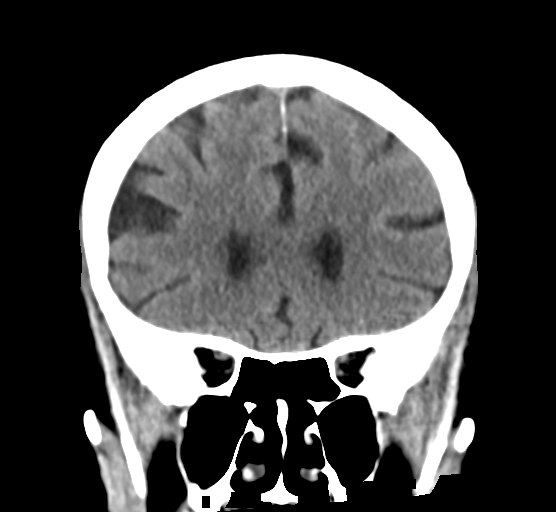
[im 15/34  brain]
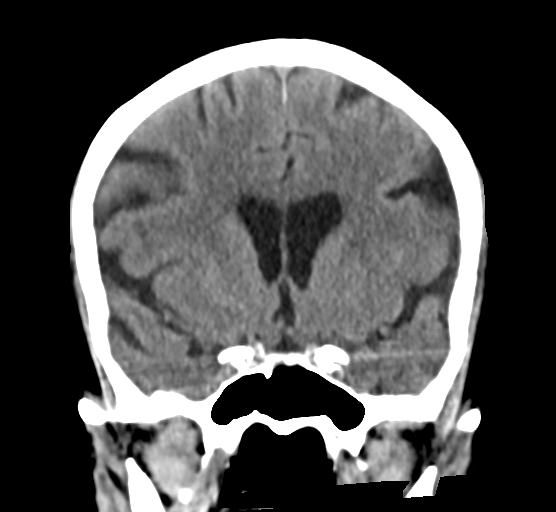
[im 19/34  brain]
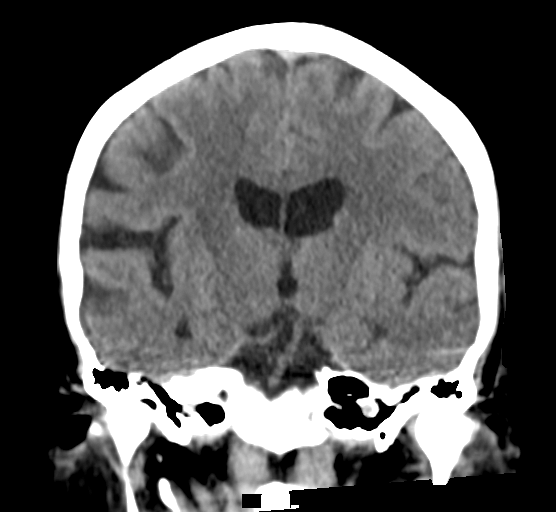

[Series 6: brain sag 5.00 hr40 s3 · sagittal · 0.31mm/px · 3 of 33 slices shown]
[im 11/33  brain]
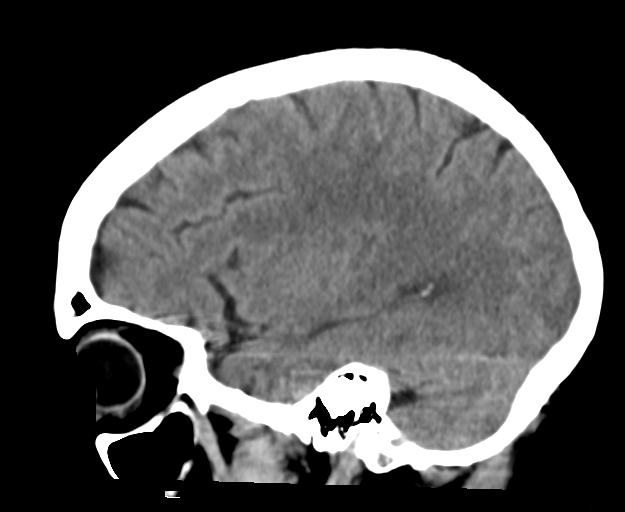
[im 17/33  brain]
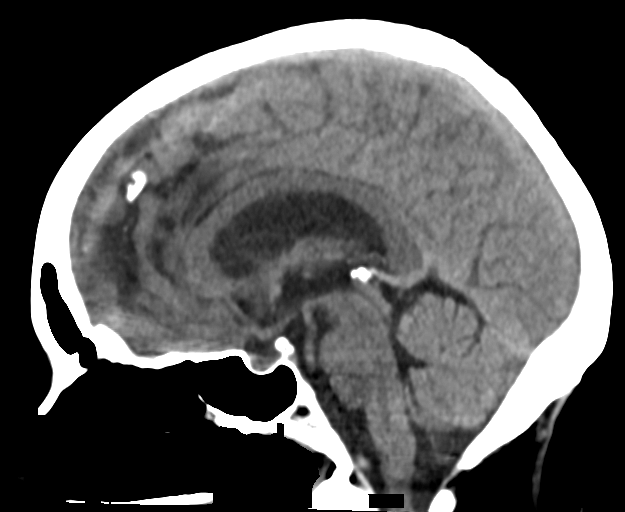
[im 22/33  brain]
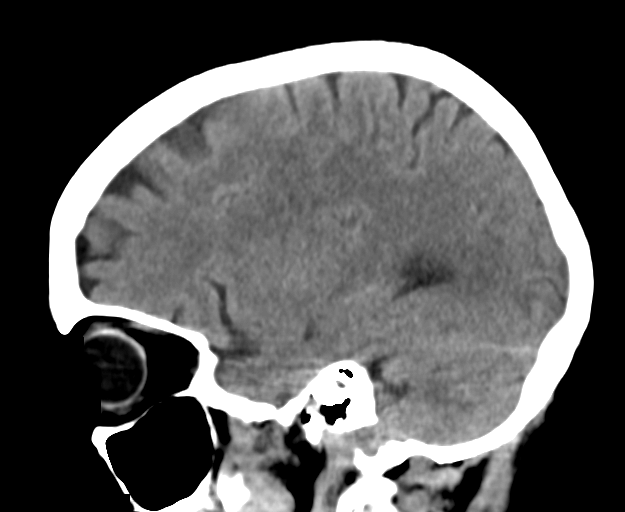

[Series 8: brain ax 2.00 hr60 s3 · axial · 0.34mm/px · z∈[-519,-389]mm · 8 of 77 slices shown, 10 images]
[im 6/77  brain]
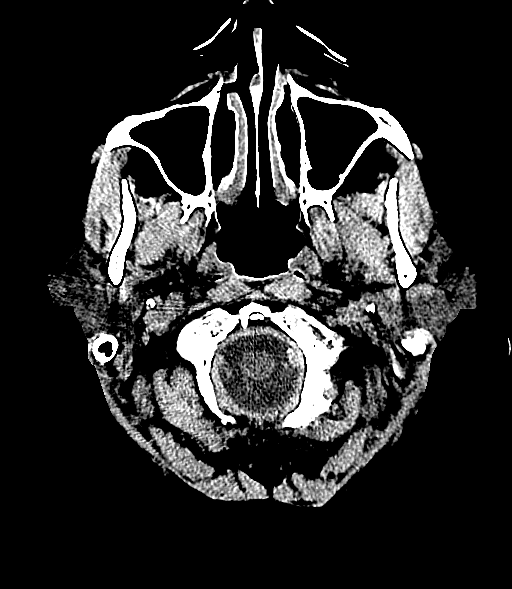
[im 6/77  bone]
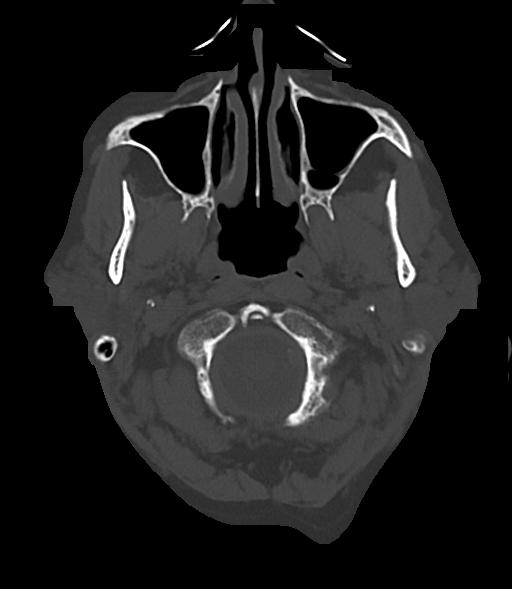
[im 15/77  brain]
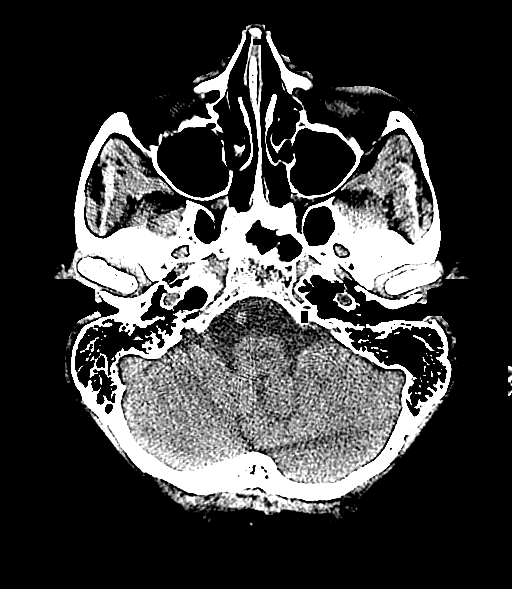
[im 26/77  brain]
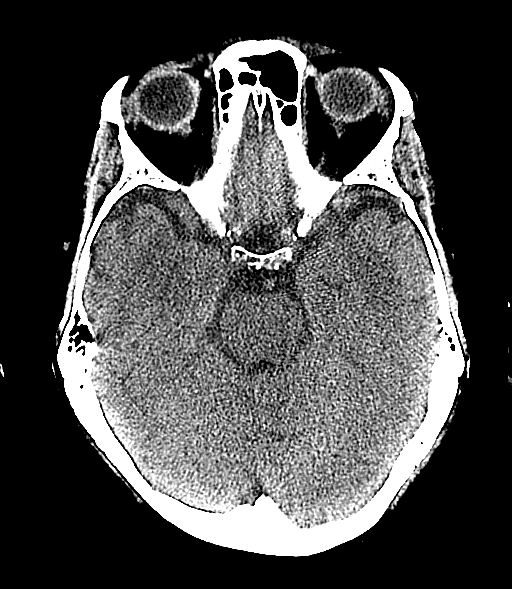
[im 34/77  brain]
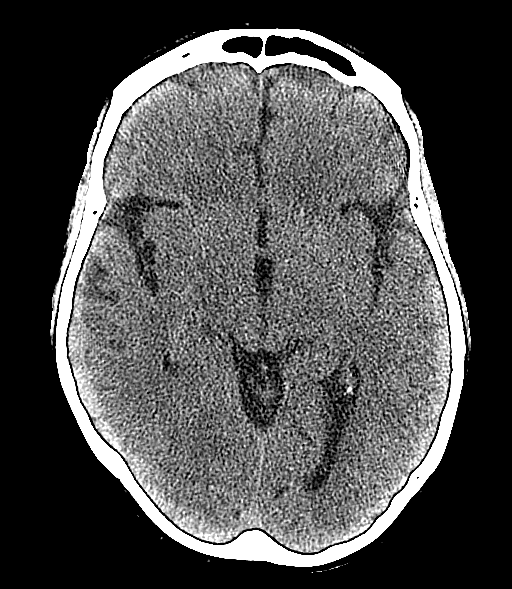
[im 43/77  brain]
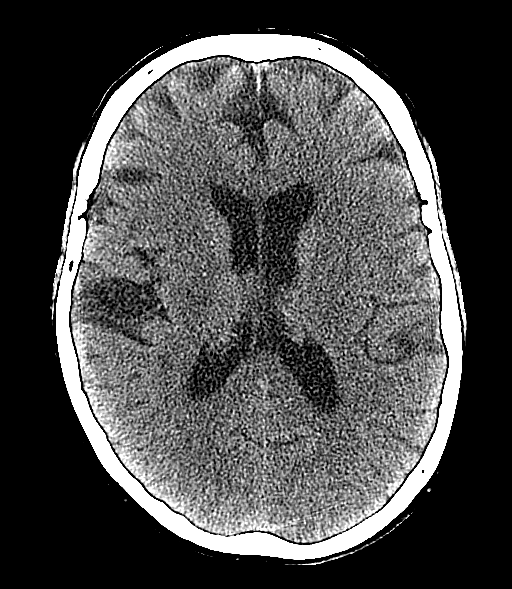
[im 43/77  bone]
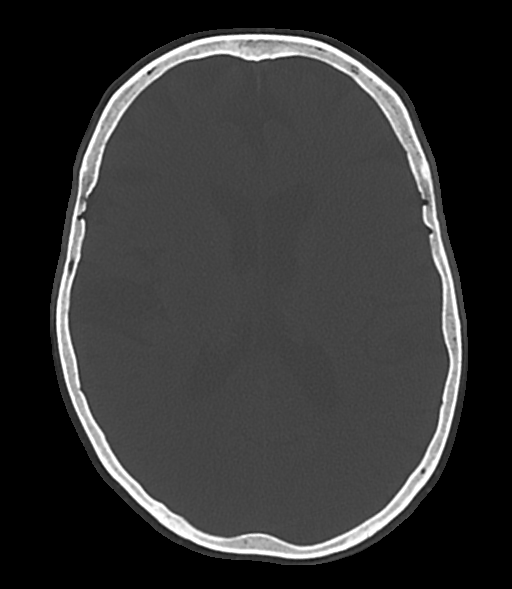
[im 51/77  brain]
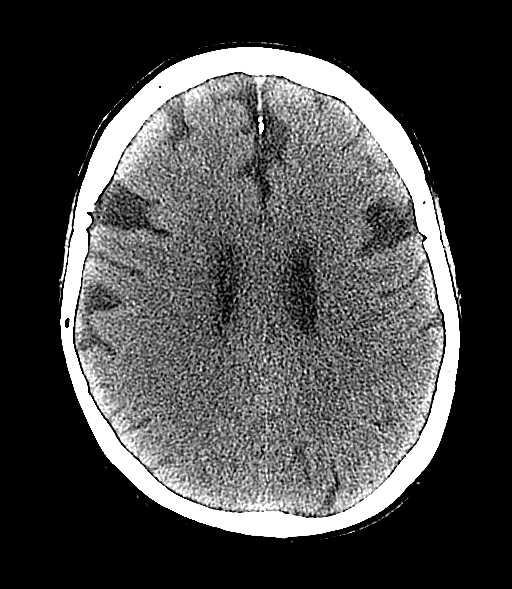
[im 62/77  brain]
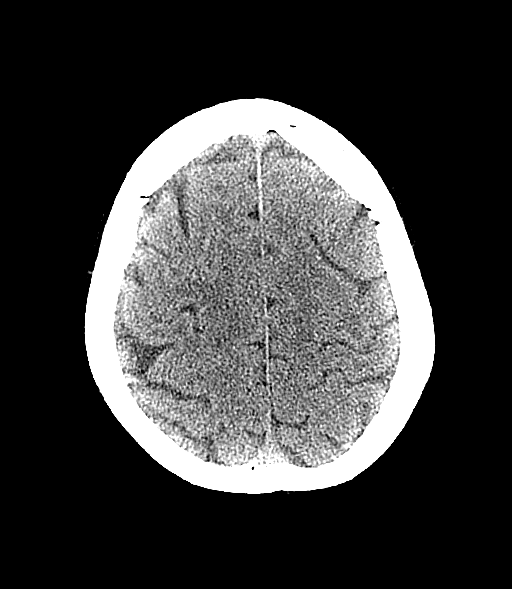
[im 71/77  brain]
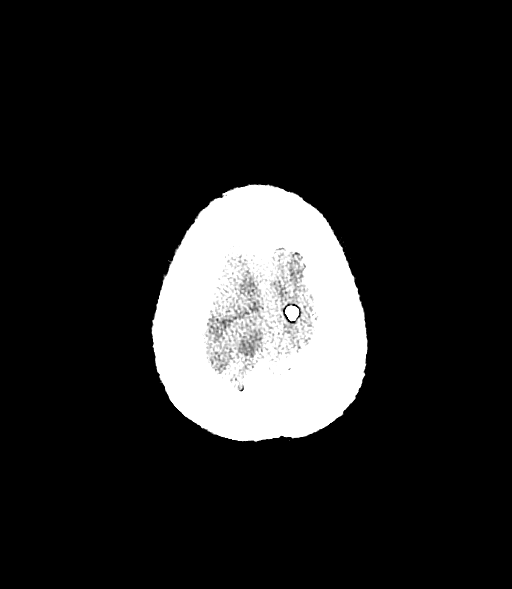

[14 of 47 positions shown; findings below may reference images not displayed]

DIAGNOSTIC STUDIES

EXAM

CT head without contrast

INDICATION

left sided jaw/arm numbness
PT STATES HAS LEFT SIDE JAW PAIN/NUMBNESS AND INTO LEFT ARM. CT/NM 0/0. TJ/KF

TECHNIQUE

All CT scans at this facility use dose modulation, iterative reconstruction, and/or weight based
dosing when appropriate to reduce radiation dose to as low as reasonably achievable.

Number of previous computed tomography exams in the last 12 months is 0  .

Number of previous nuclear medicine myocardial perfusion studies in the last 12 months is 0  .

COMPARISONS

None available

FINDINGS

Minimal cortical atrophy is seen. No intracranial hemorrhage or mass is evident. Bony calvarium is
normal.

IMPRESSION

No intracranial mass or hemorrhage. There is calcification of the cavernous carotid arteries.
Report called to Maria Guadalupe Elbaz at [DATE] p.m.

Tech Notes:

PT STATES HAS LEFT SIDE JAW PAIN/NUMBNESS AND INTO LEFT ARM. CT/NM 0/0. TJ/KF

## 2020-07-31 IMAGING — US ECHOCOMPL
1 series · 12 of 24 positions shown · non-contrast
Comparison: none

[Series 1: us echo 2d, complete · 107 acquisitions, 12 frames shown]
[im 5/107]
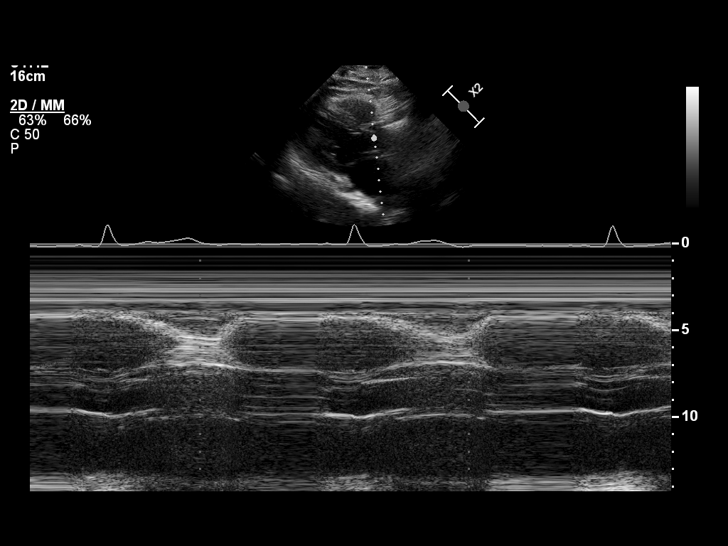
[im 14/107]
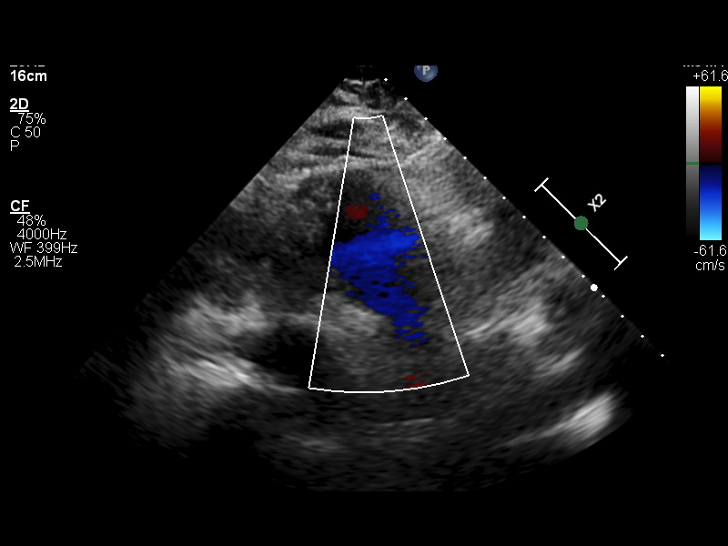
[im 24/107]
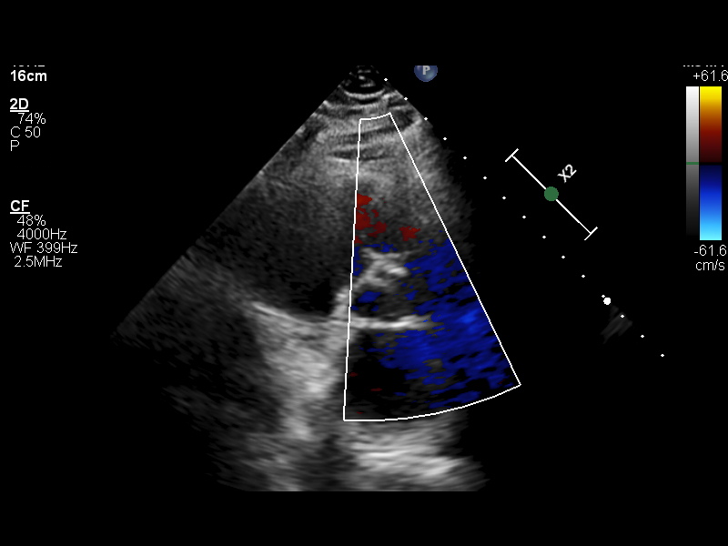
[im 37/107]
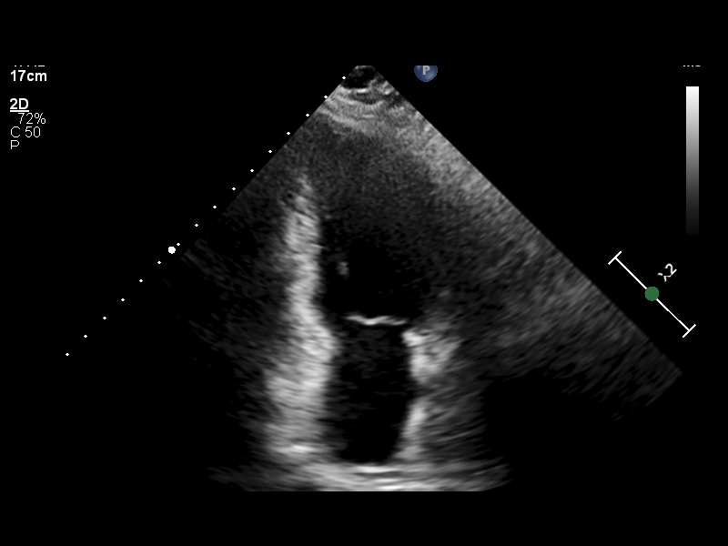
[im 42/107]
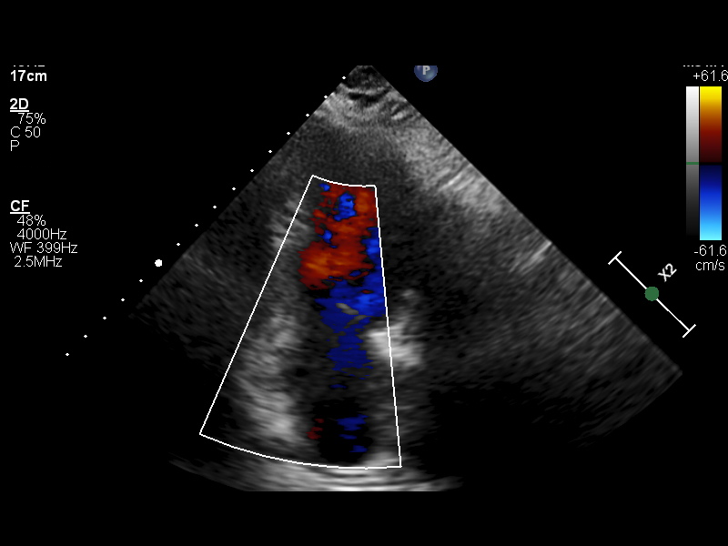
[im 56/107]
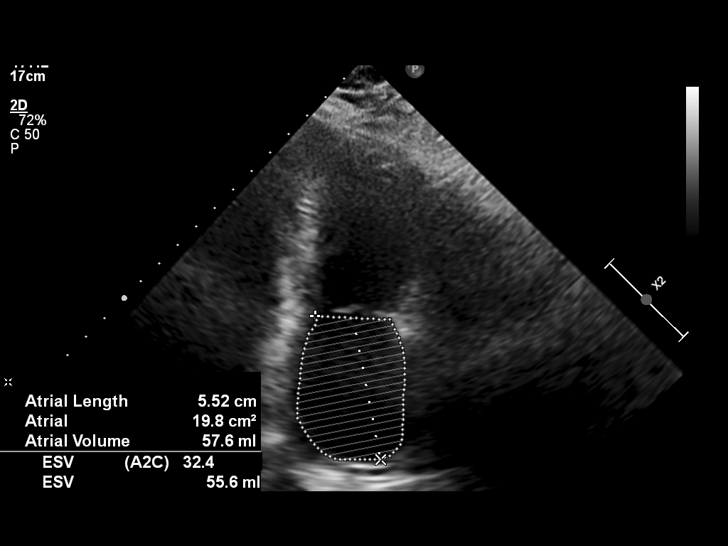
[im 65/107]
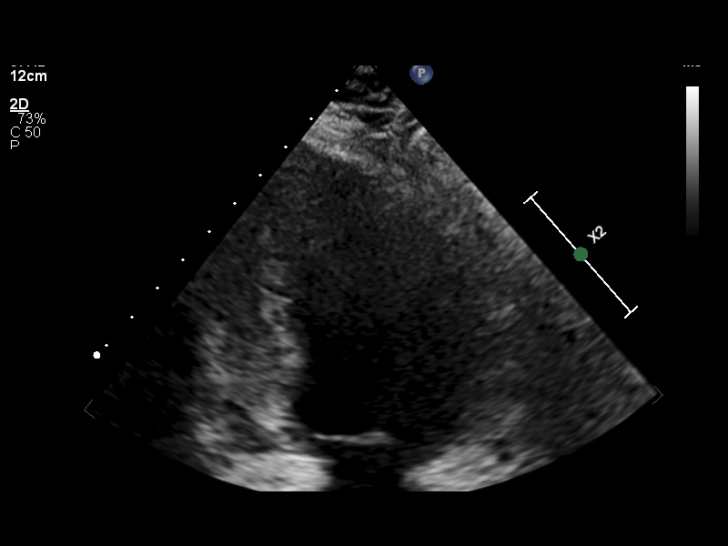
[im 74/107]
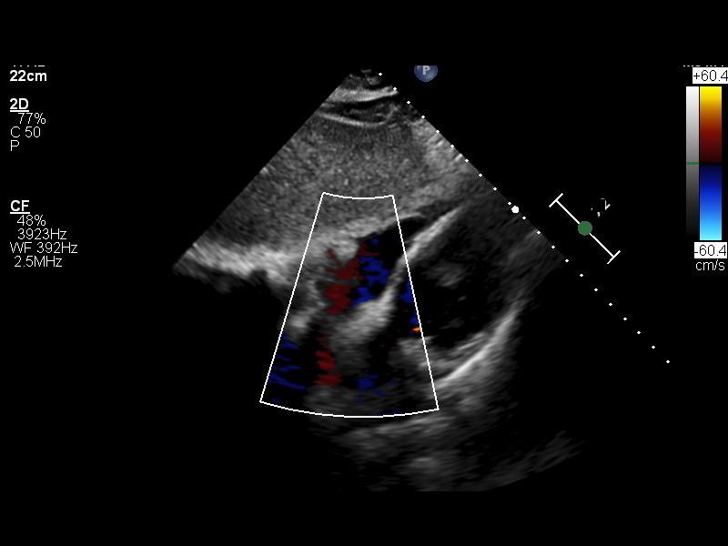
[im 83/107]
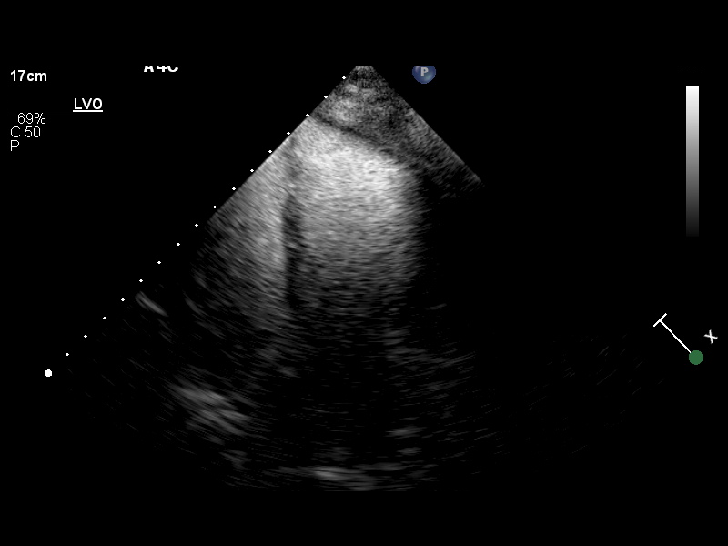
[im 88/107]
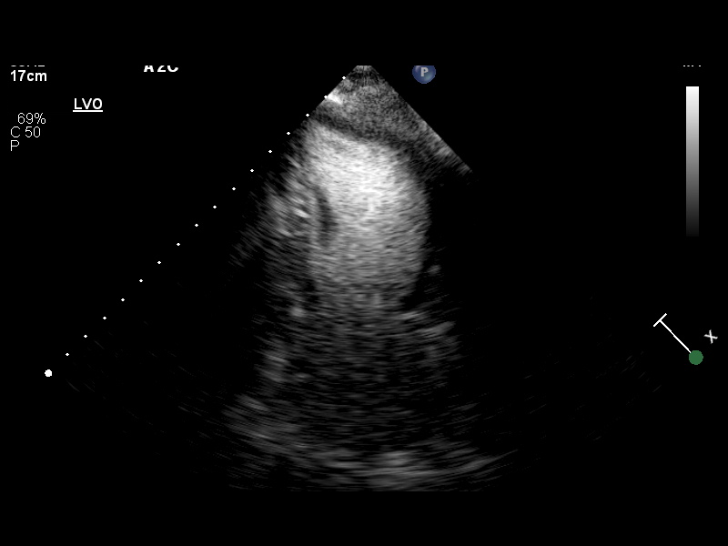
[im 97/107]
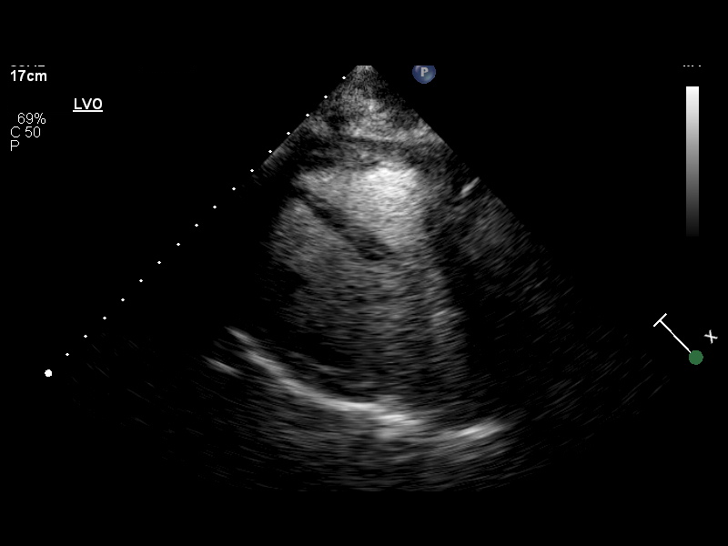
[im 107/107]
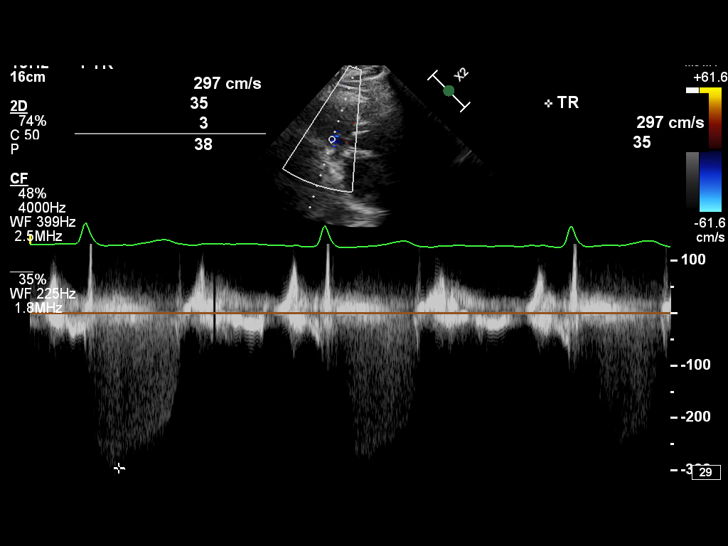

[12 of 24 positions shown; findings below may reference images not displayed]

08/01/20 -  2D + DOPPLER ECHO
Location Performed: [HOSPITAL]

Referring Provider: Bhebhe, Rantona
Interpreting Physician: Boswell, Figueroa
Ebrahimi:
Location of Interp:
Sonographer: External Staff  [facility]
Machine:  [facility] [id number]

Indications: Stroke

Saline contrast was given to evaluate for intracardiac shunt.
Definity contrast was given to enhance imaging. Technically difficult study with poor endocardial
visualization.

Vitals
Height   Weight   BSA (Calculated)   BP   Comments
157.5 cm (5' 2")   76.7 kg (169 lb)   1.83   155/74

Interpretation Summary
Normal left ventricular systolic function with an ejection fraction of 55-60%.
Normal right ventricular size and function.
Normal diastolic function.
No regional wall motion abnormality.
No chamber enlargement.
No significant valvular stenosis or regurgitation noted.  Trileaflet aortic valve.
No pericardial effusion.
Visualized portions of the aortic root and ascending thoracic aorta are within normal limits.
No LV thrombus.
Negative agitated saline bubble study.  No demonstration of intracardiac or transpulmonary shunting.

No prior study for comparison.

Echocardiographic Findings
Left Ventricle   The left ventricular size, wall thickness and systolic function are normal. Normal
left ventricular diastolic function. Normal left atrial pressure.
Right Ventricle   The right ventricular size, wall thickness and systolic function are normal.
Left Atrium   There is no interatrial shunting by color flow Doppler and saline contrast studies.
Right Atrium   Normal size.
IVC/SVC   Normal central venous pressure (0-5 mm Hg).
Mitral Valve   Normal valve structure. No stenosis. Trace regurgitation.
Tricuspid Valve   Normal valve structure. No stenosis. Trace regurgitation.
Aortic Valve   The valve has focal thickening. No stenosis. No regurgitation.
Pulmonary   The pulmonic valve was not seen well but no Doppler evidence of stenosis.
Aorta   The aortic root and ascending aorta are normal in size.
Pericardium   Pericardial fat pad present. No pericardial effusion.

Left Heart 2D Measurements (Normal Ranges)
LVIDD
4.8 cm (Range: 3.8 - 5.2)
LVIDS
3.4 cm (Range: 2.2 - 3.5)
IVS
0.8 cm (Range: 0.6 - 0.9)
LV PW
0.7 cm (Range: 0.6 - 0.9)
LA Size
3.6 cm (Range: 2.7 - 3.8)

Right Heart 2D   M-Mode Measurements (Normal Ranges) (Range)
RV Basal Dia
2.4 cm (2.5 - 4.1)
RV Mid Dia
1.9 cm (1.9 - 3.5)
ADIRA
9.9 cm2 (<18)
M-Mode TAPSE
2.2 cm (>1.7)

Left Heart 2D Addnl Measurements (Normal Ranges)
LA Vol
56 mL (Range: 22 - 52)
LA Vol Index
30.60 (Range: 16 - 34)
LV Mass
117 g (Range: 67 - 162)
LV Mass Index
64 g/m2 (Range: 43 - 95)
RWT
0.29 (Range: <=0.42)

Aortic Root Measurements (Normal Ranges)
Sinus
2.8 cm (Range: 2.4 - 3.6)
AO Prox
2.8 cm (Range: 1.9 - 3.5)

Doppler (Spectral and Color Flow)
Estimated Peak Systolic PA Pressure
Aortic valve area
2.28 cm2
Aortic valve mean gradient
Aortic valve peak gradient
Aortic valve peak velocity
1.4 m/s
Aortic valve velocity ratio

Tech Notes:

definity given lot #[id number] tm

## 2020-07-31 IMAGING — MR Head^Brain
8 series · 41 of 48 positions shown · non-contrast
Comparison: none

[Series 4: DWI · axial · 5.0mm · 1.80mm/px · z∈[-15,+101]mm · 9 of 59 slices shown (1 of 2)]
[im 1/59]
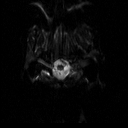
[im 10/59]
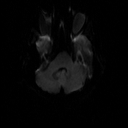
[im 20/59]
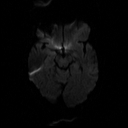
[im 25/59]
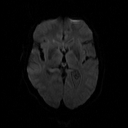
[im 30/59]
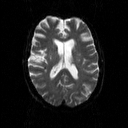
[im 34/59]
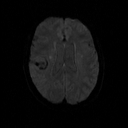
[im 39/59]
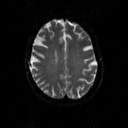
[im 49/59]
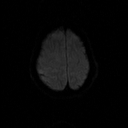
[im 59/59]
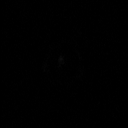

[Series 5: DWI · axial · 5.0mm · 1.80mm/px · z∈[-15,+101]mm · 5 of 21 slices shown (2 of 2)]
[im 1/21]
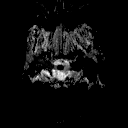
[im 6/21]
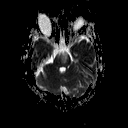
[im 11/21]
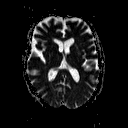
[im 16/21]
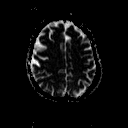
[im 21/21]
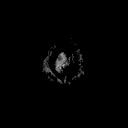

[Series 6: T1 · sagittal · 5.0mm · 0.45mm/px · 4 of 20 slices shown (1 of 2)]
[im 1/20]
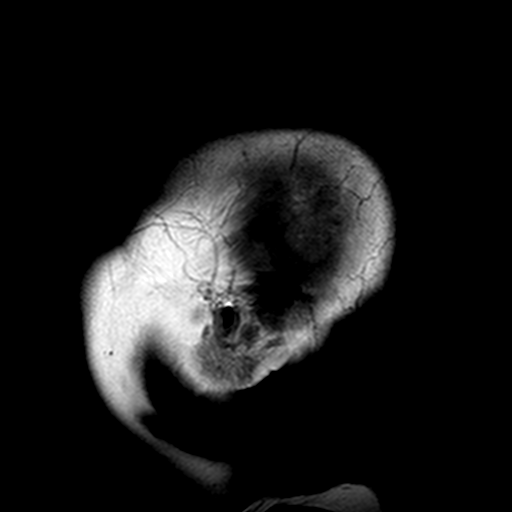
[im 7/20]
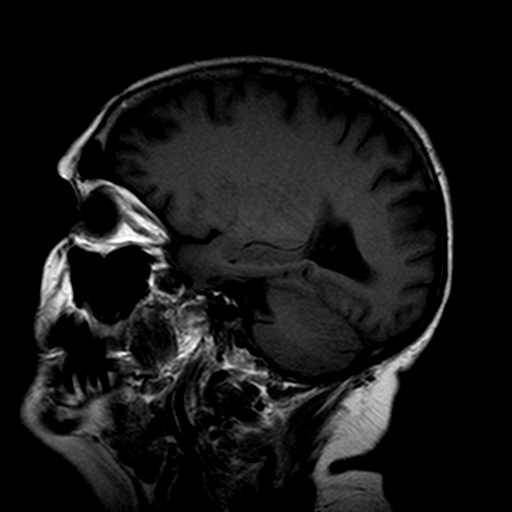
[im 13/20]
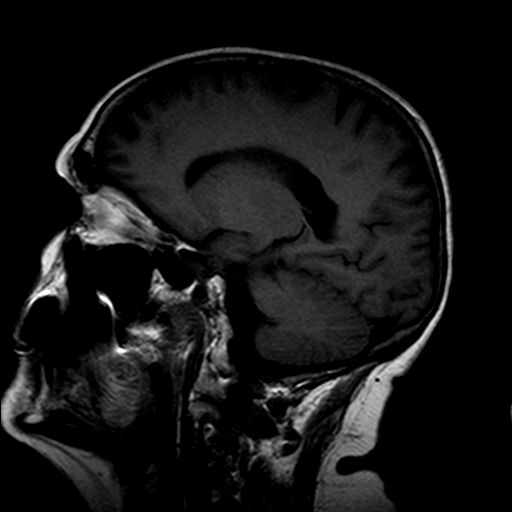
[im 20/20]
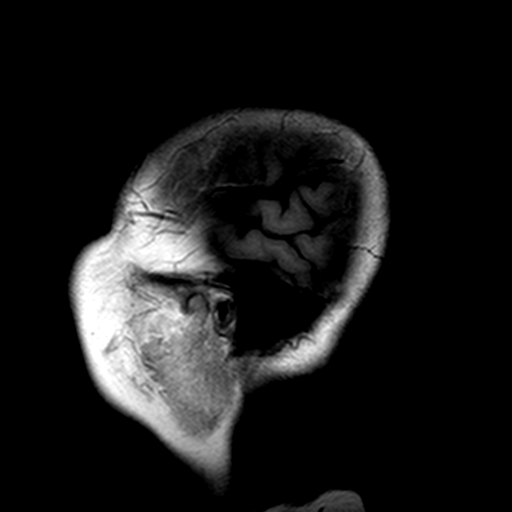

[Series 10: FLAIR · axial · 5.0mm · 0.45mm/px · z∈[-27,+110]mm · 5 of 24 slices shown]
[im 1/24]
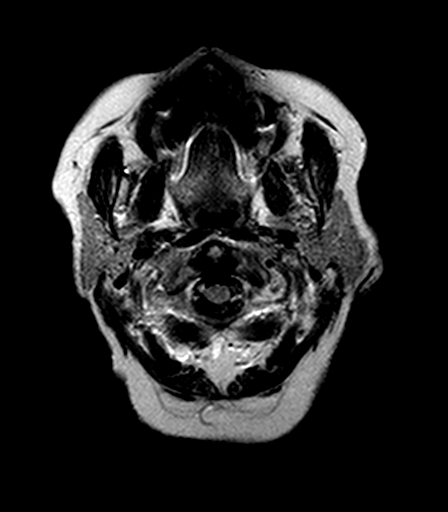
[im 6/24]
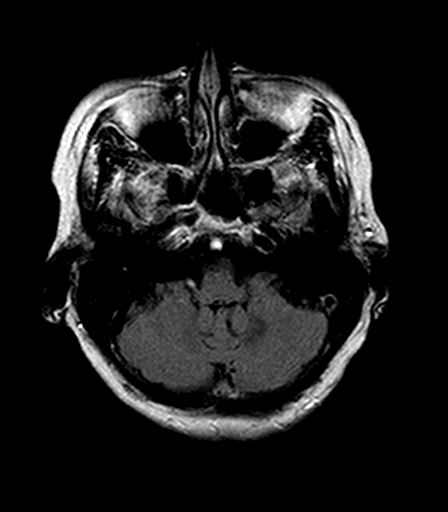
[im 12/24]
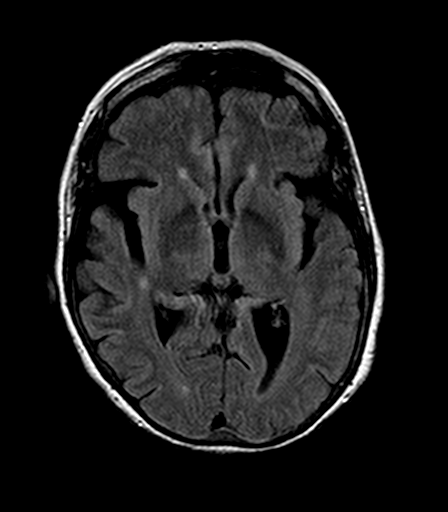
[im 18/24]
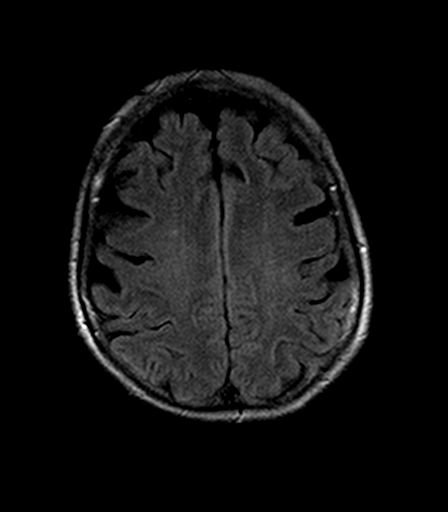
[im 24/24]
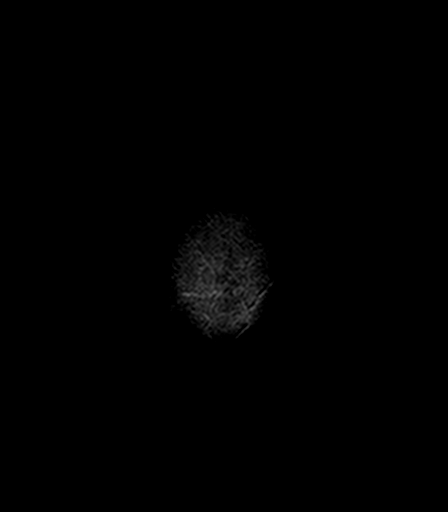

[Series 11: T2 · axial · 5.0mm · 0.72mm/px · z∈[-27,+110]mm · 5 of 24 slices shown (1 of 2)]
[im 1/24]
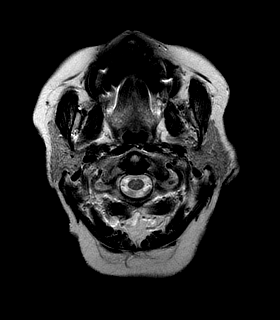
[im 6/24]
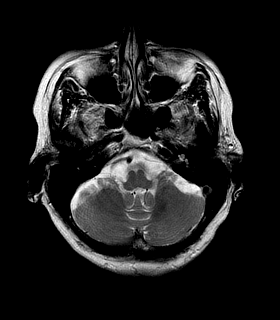
[im 12/24]
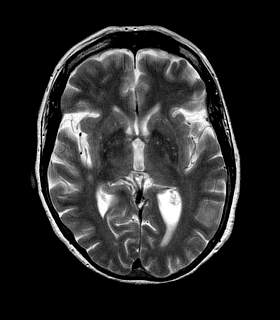
[im 18/24]
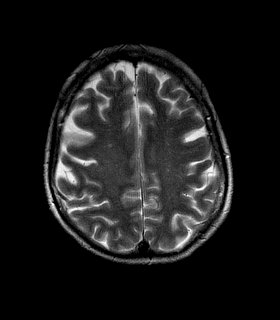
[im 24/24]
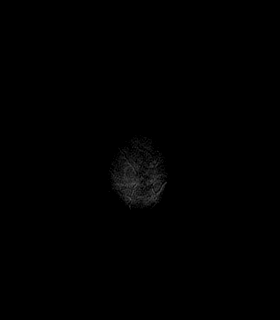

[Series 13: T1 · axial · 5.0mm · 0.45mm/px · z∈[-27,+110]mm · 5 of 24 slices shown (2 of 2)]
[im 1/24]
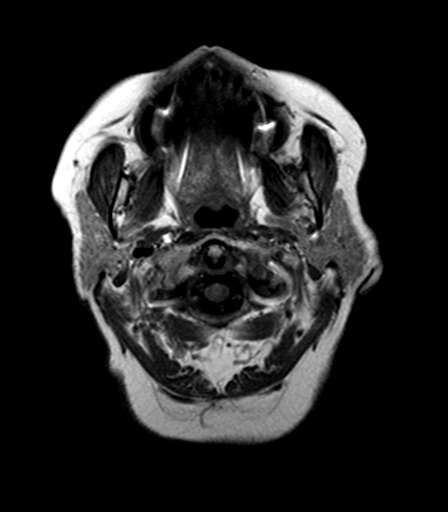
[im 6/24]
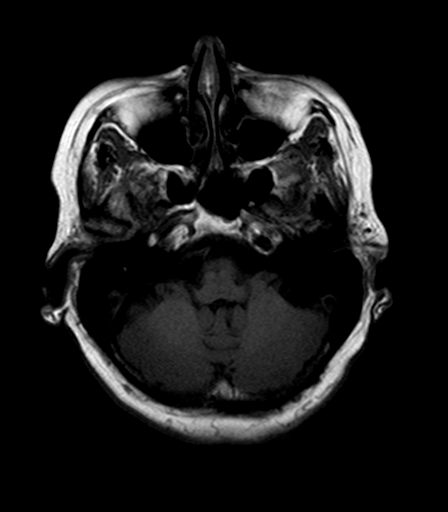
[im 12/24]
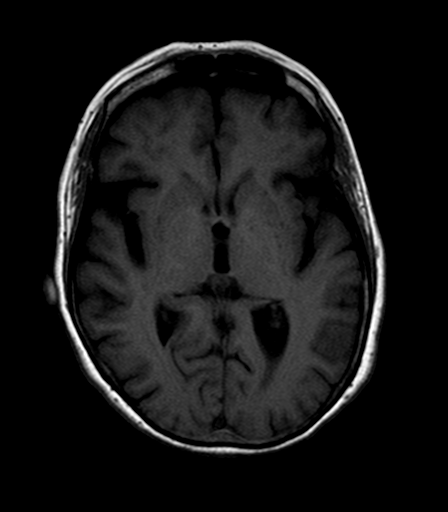
[im 18/24]
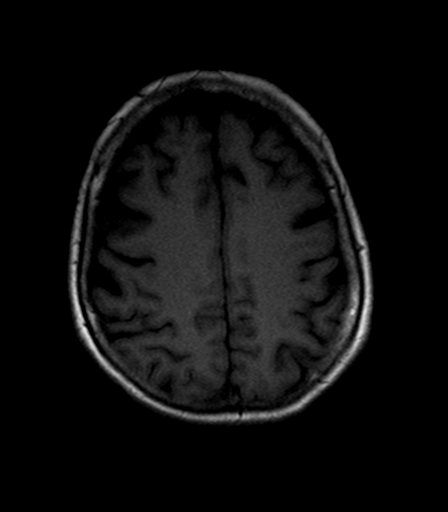
[im 24/24]
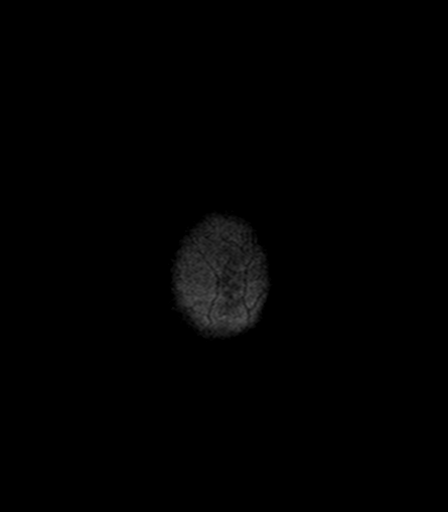

[Series 14: axial blood · axial · 5.0mm · 0.45mm/px · z∈[-27,+3]mm · 2 of 24 slices shown]
[im 1/24]
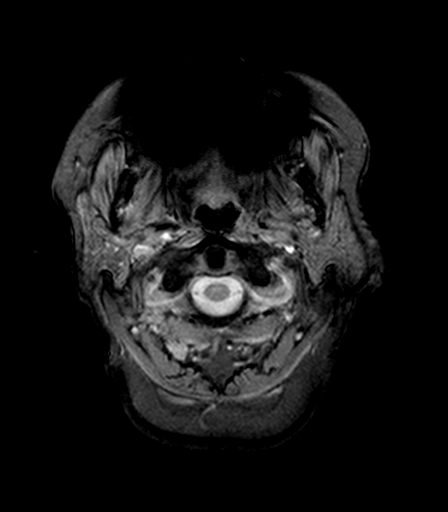
[im 6/24]
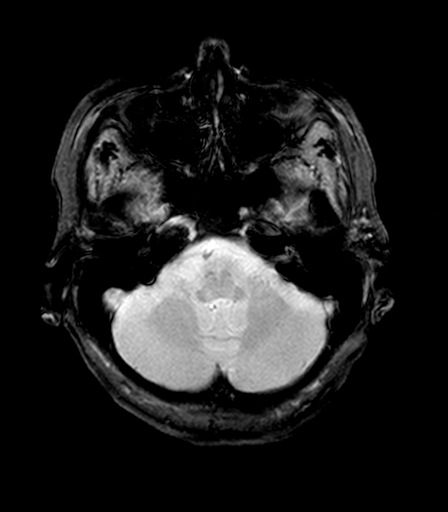

[Series 15: T2 · coronal · 5.0mm · 0.69mm/px · 6 of 26 slices shown (2 of 2)]
[im 1/26]
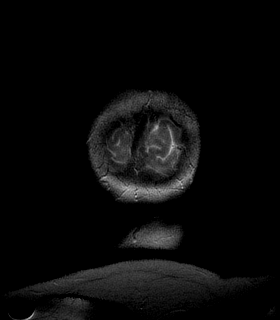
[im 6/26]
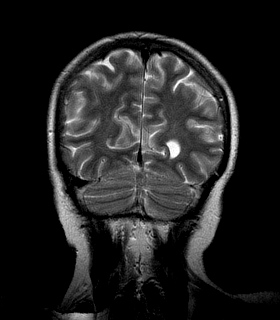
[im 11/26]
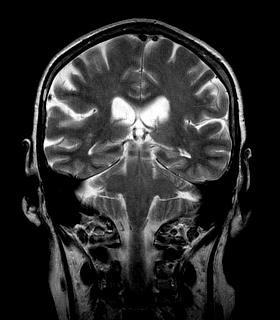
[im 16/26]
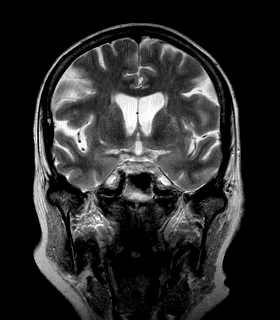
[im 21/26]
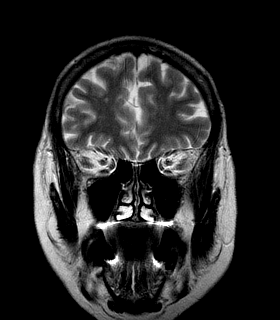
[im 26/26]
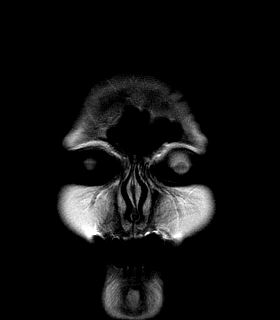

[41 of 48 positions shown; findings below may reference images not displayed]

DIAGNOSTIC STUDIES

EXAM

MAGNETIC RESONANCE IMAGING, BRAIN (INCLUDING BRAIN STEM) WITHOUT CONTRAST MATERIAL, CPT 45113

INDICATION

Left-sided numbness.

TECHNIQUE

Multiplanar, multipulse sequence images of the head without contrast.

COMPARISONS

No priors available for comparison.

FINDINGS

Mild atrophy. Mild periventricular white matter changes. Focus of restricted diffusion within the
right occipital lobe, image 54. Focus of restricted diffusion within the left occipital s lobe,
image 51. No gross hemorrhage or hydrocephalus. No remote hemorrhage on the gradient images. No
intra or extra-axial fluid collections. The visualized orbits appear grossly unremarkable. The
paranasal sinuses appear well aerated. The mastoid air cells appear well aerated.

IMPRESSION

Acute lacunar infarct within the left and right occipital lobe.

The report was called on 07/31/2020 at [DATE] p.m..

Tech Notes:

PT HAD AN EPISODE OF LEFT SIDED FACIAL NUMBNESS AND LEFT ARM NUMBNESS, LASTING ROUGHLY 5 MINUTES.
NOW RESOLVED.  RG

## 2020-07-31 IMAGING — MR Head^Brain
1 series · 48 of 48 positions shown · non-contrast
Comparison: none

[Series 4: cow · axial · 0.8mm · 0.56mm/px · z∈[-33,+32]mm · 48 of 90 slices shown]
[im 1/90]
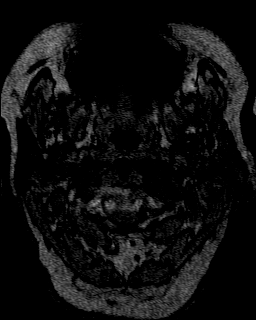
[im 2/90]
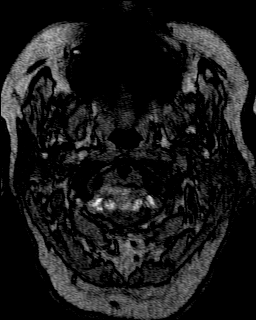
[im 4/90]
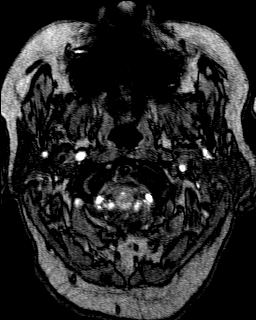
[im 6/90]
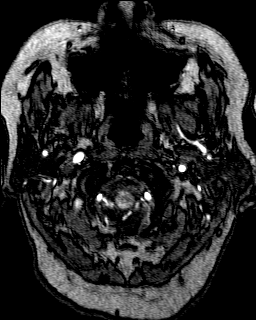
[im 8/90]
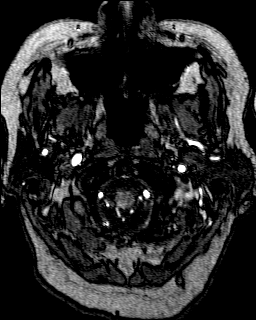
[im 10/90]
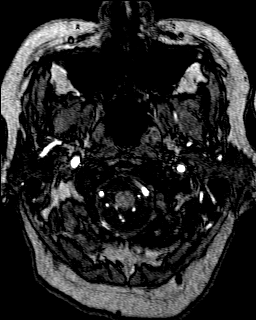
[im 12/90]
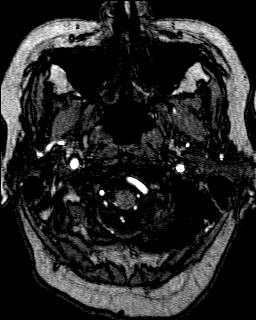
[im 14/90]
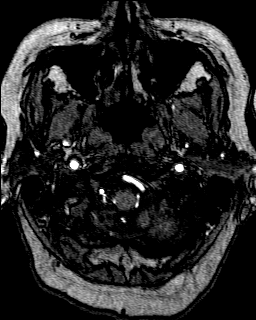
[im 16/90]
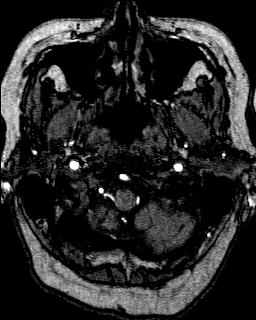
[im 18/90]
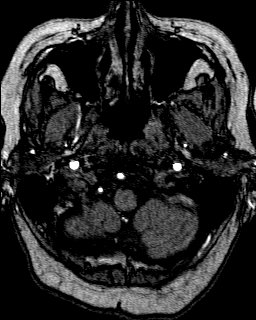
[im 19/90]
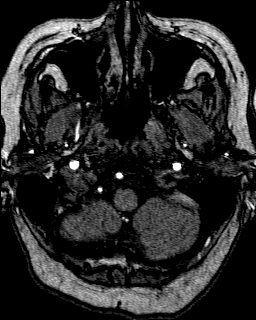
[im 21/90]
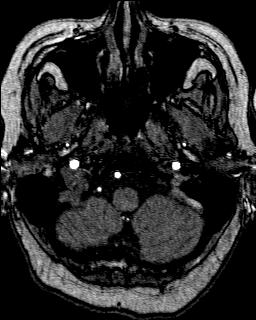
[im 23/90]
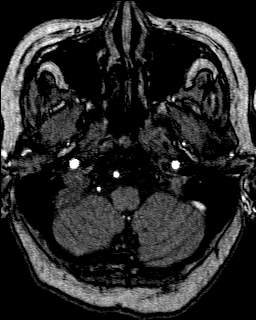
[im 25/90]
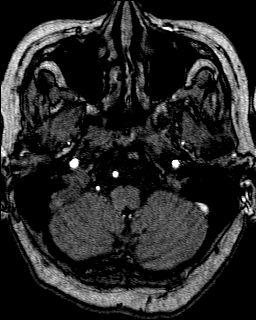
[im 27/90]
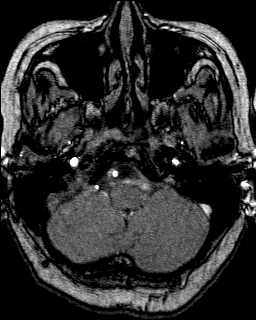
[im 29/90]
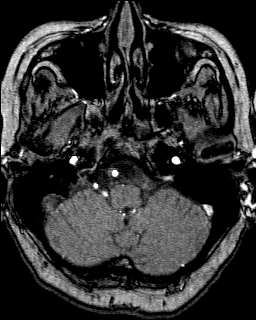
[im 31/90]
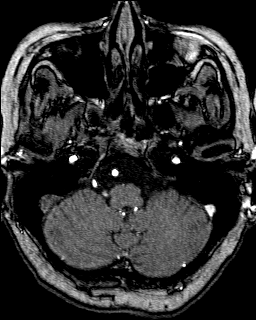
[im 33/90]
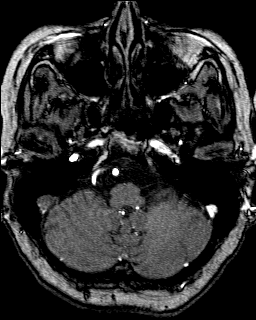
[im 35/90]
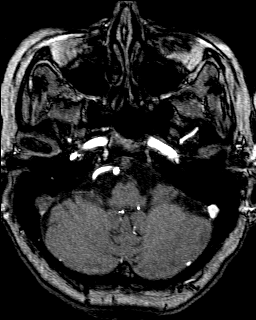
[im 36/90]
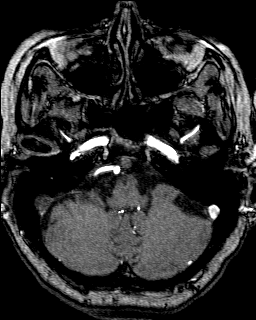
[im 38/90]
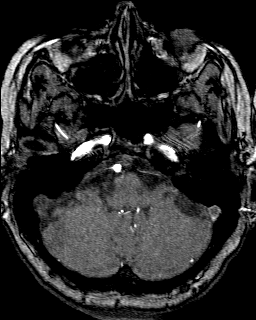
[im 40/90]
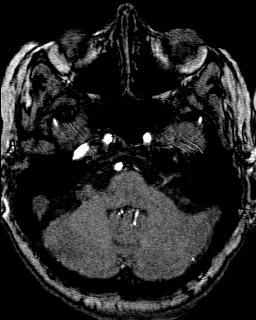
[im 42/90]
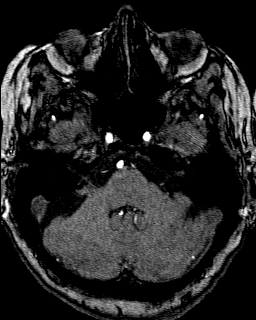
[im 44/90]
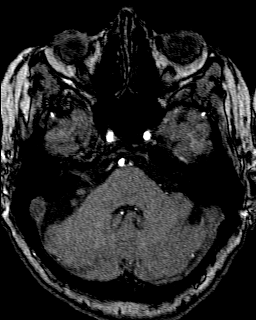
[im 46/90]
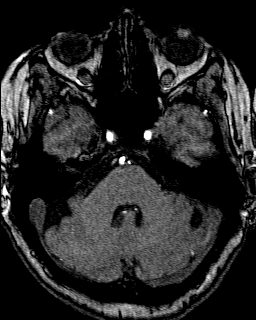
[im 48/90]
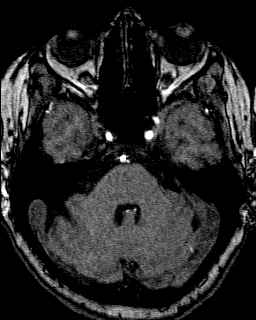
[im 50/90]
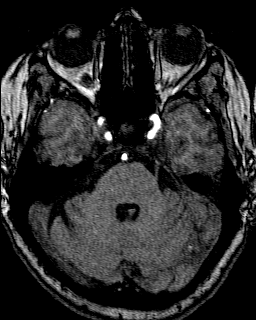
[im 52/90]
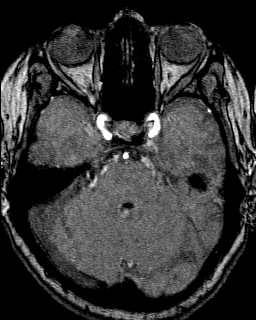
[im 54/90]
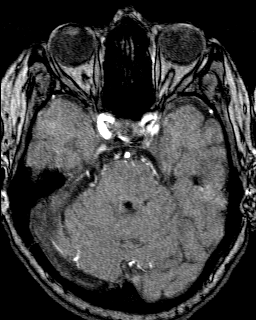
[im 55/90]
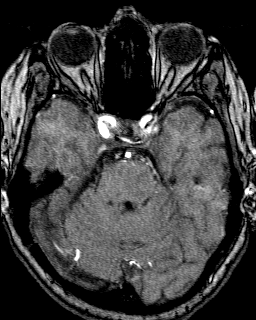
[im 57/90]
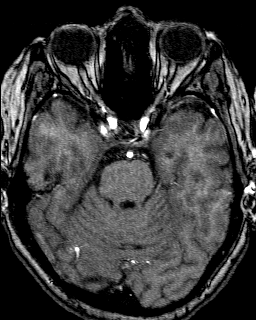
[im 59/90]
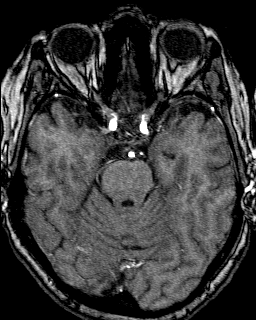
[im 61/90]
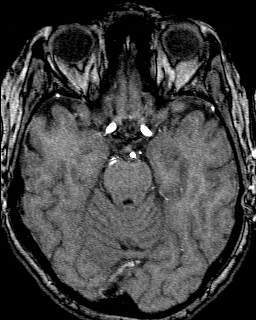
[im 63/90]
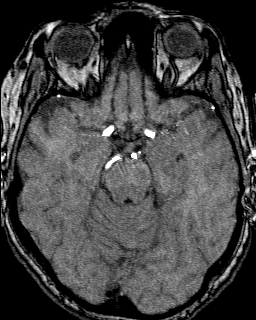
[im 65/90]
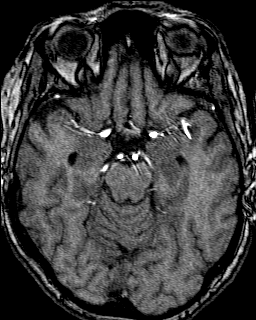
[im 67/90]
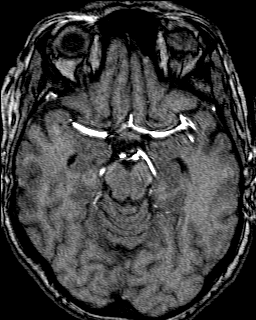
[im 69/90]
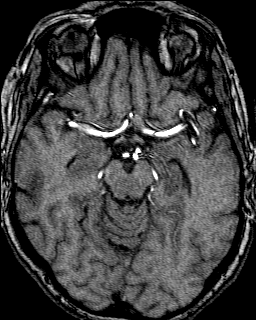
[im 71/90]
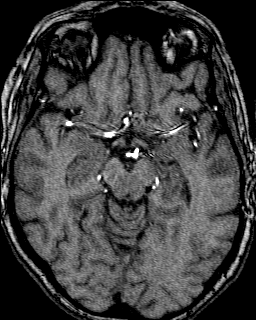
[im 72/90]
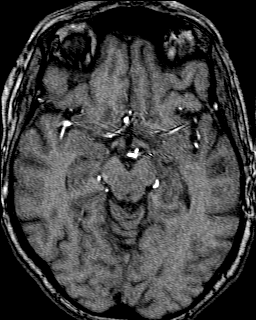
[im 74/90]
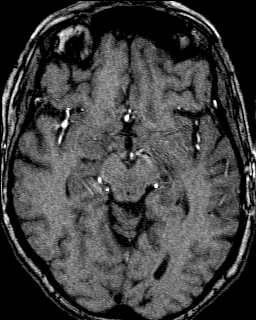
[im 76/90]
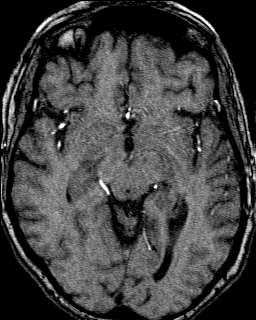
[im 78/90]
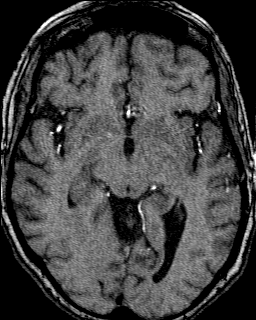
[im 80/90]
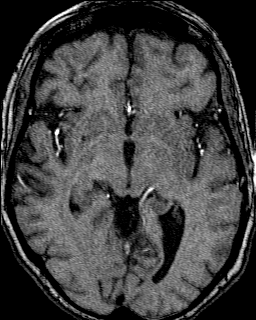
[im 82/90]
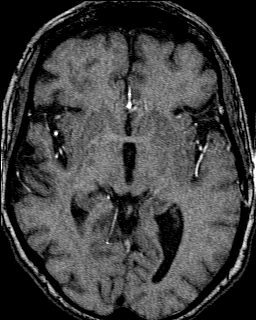
[im 84/90]
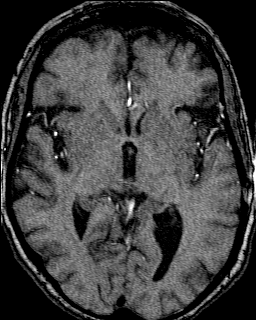
[im 86/90]
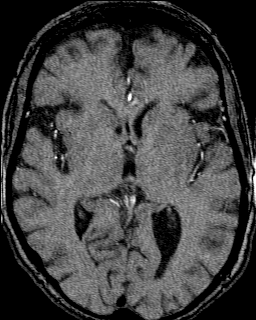
[im 88/90]
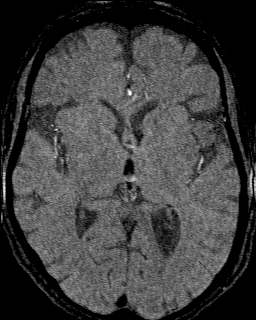
[im 90/90]
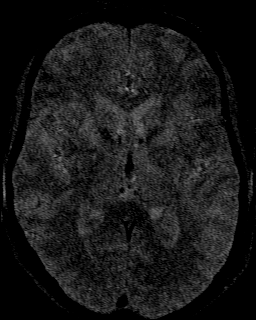

[48 of 48 positions shown; findings below may reference images not displayed]

DIAGNOSTIC STUDIES

EXAM

MAGNETIC RESONANCE ANGIOGRAPHY, HEAD, WITHOUT CONTRAST MATERIAL, CPT 91999

INDICATION

Left-sided numbness.

TECHNIQUE

3-D time of flight MRA of the Circle of Willis was performed without contrast. MIP reconstruction
was performed.

COMPARISONS

No priors available for comparison.

FINDINGS

Mild motion artifact. There is significant motion involving the petrous portion of the left
internal carotid artery. No aneurysm or occlusion involving the circle of Willis.

IMPRESSION

1. No aneurysm or occlusion of the circle of Willis.

2. Irregularity involving the petrous portion of the left internal carotid artery, which may be
secondary to motion artifact. Confirmation with CT angiography can be performed if the patient's
symptoms persist.

Tech Notes:

PT HAD AN EPISODE OF LEFT SIDED FACIAL NUMBNESS AND LEFT ARM NUMBNESS, LASTING ROUGHLY 5 MINUTES.
NOW RESOLVED.  RG

## 2020-07-31 IMAGING — US CARDUPBI
1 series · 14 of 16 positions shown · non-contrast
Comparison: none

[Series 1: us carotid duplex bi · 14 of 66 slices shown]
[im 1/66]
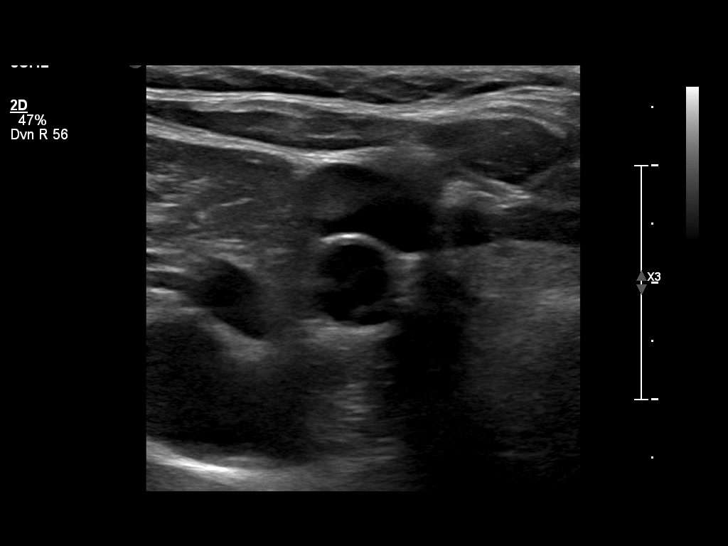
[im 5/66]
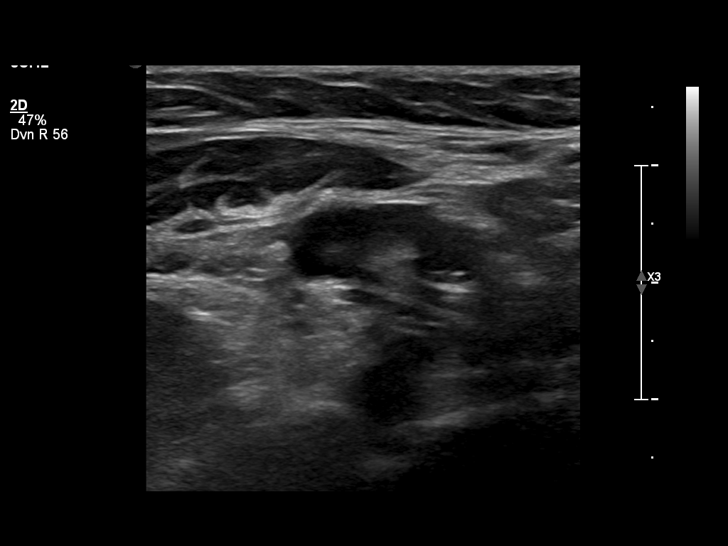
[im 9/66]
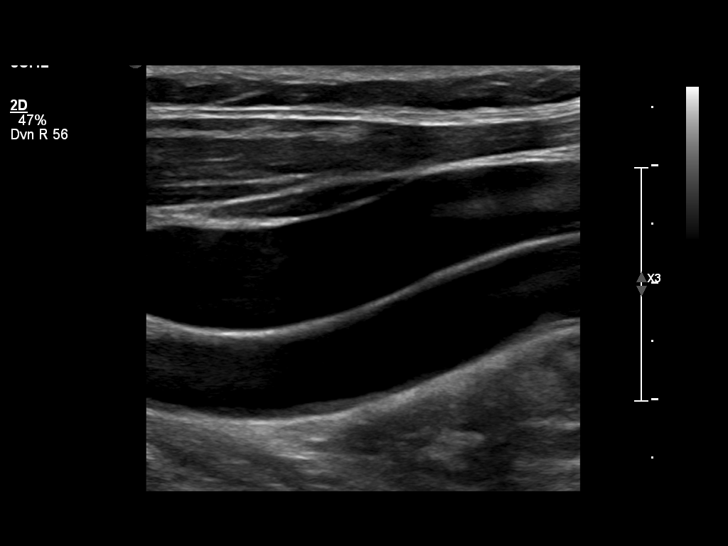
[im 18/66]
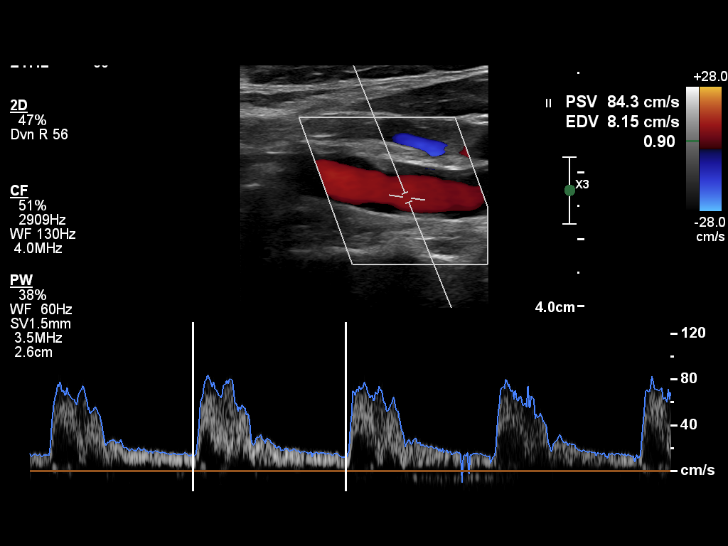
[im 22/66]
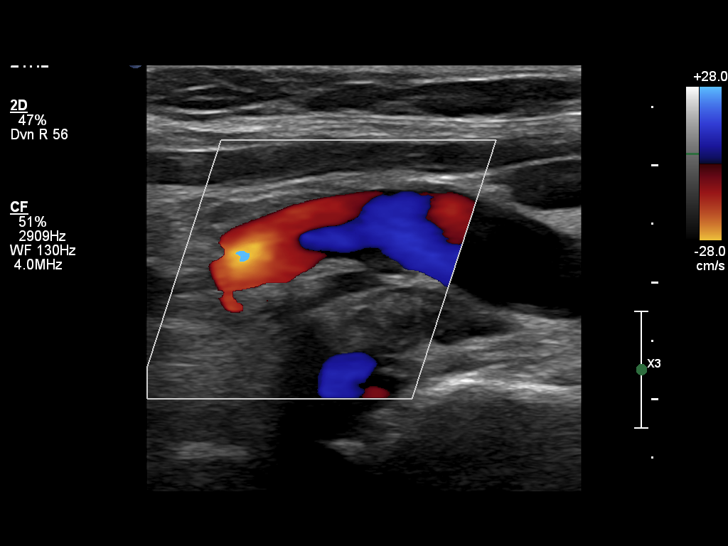
[im 27/66]
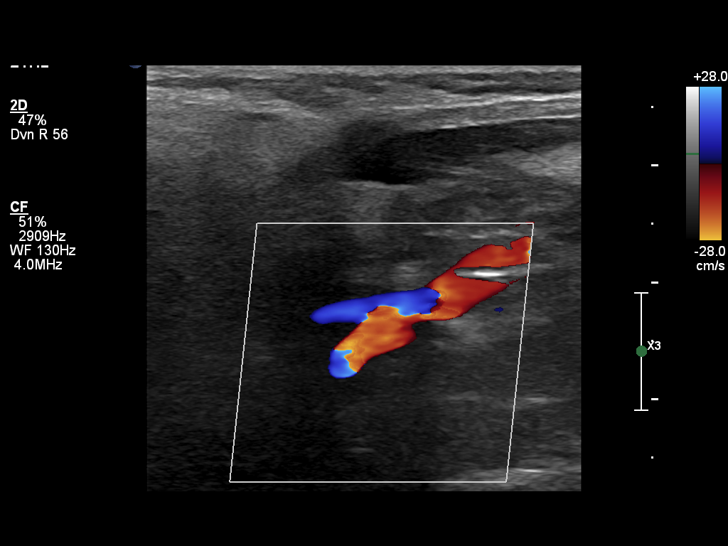
[im 31/66]
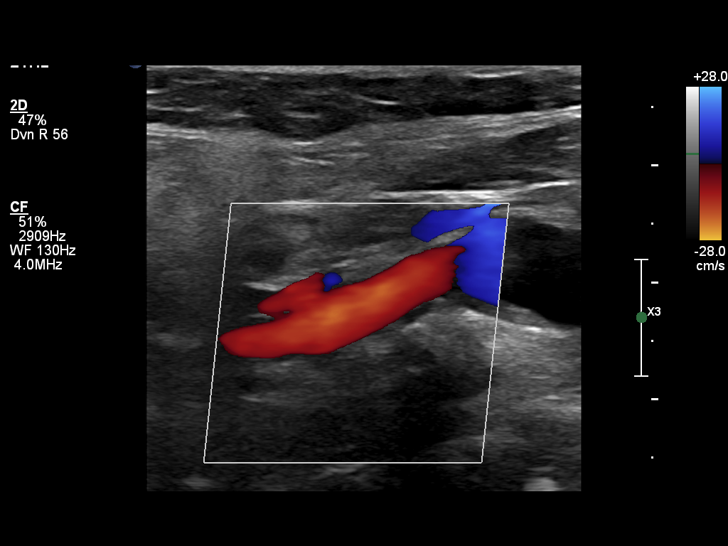
[im 35/66]
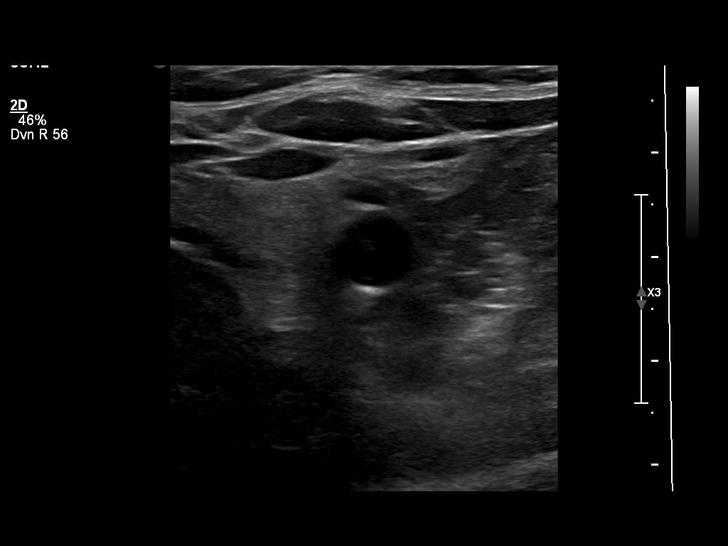
[im 40/66]
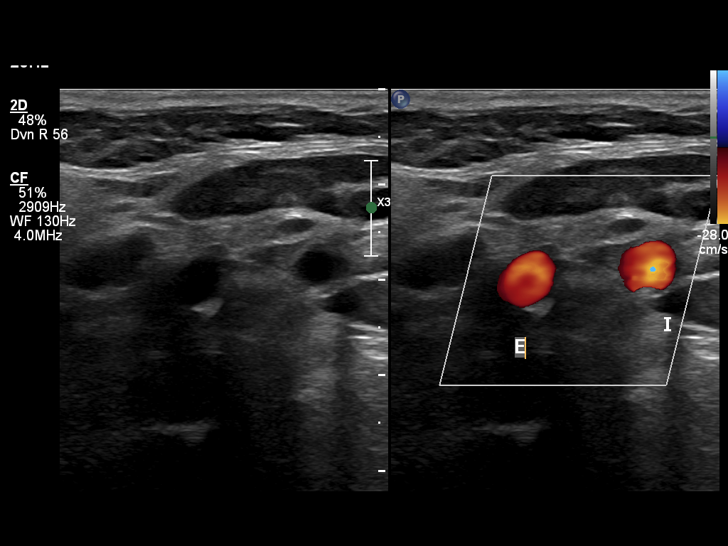
[im 44/66]
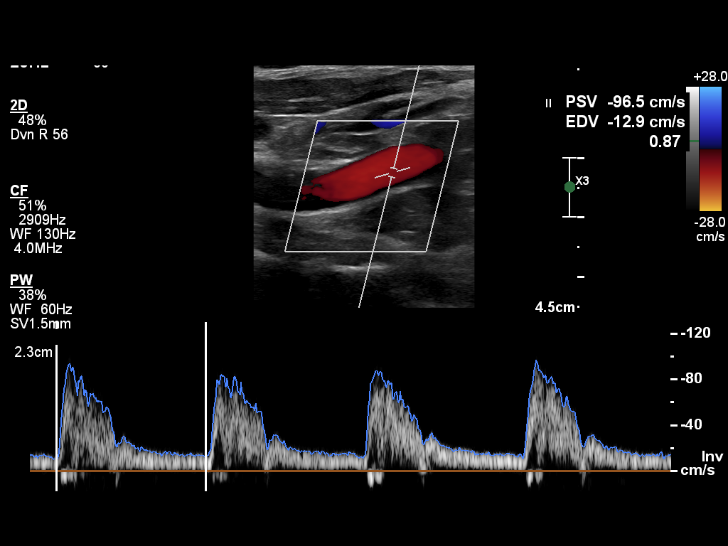
[im 53/66]
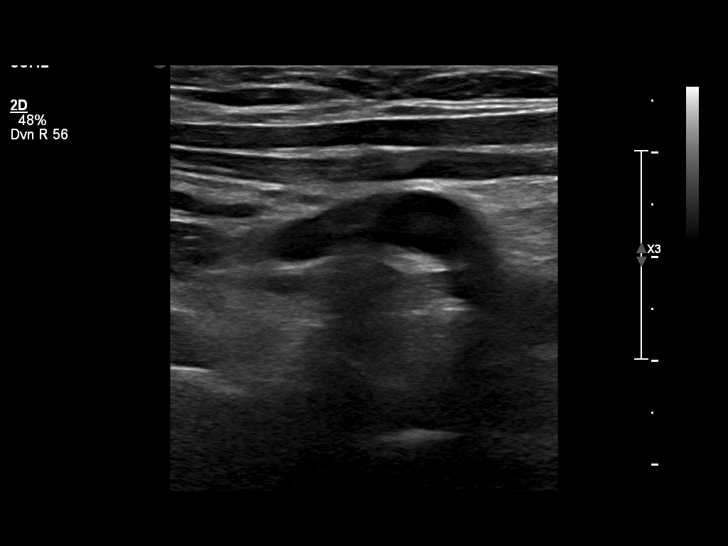
[im 57/66]
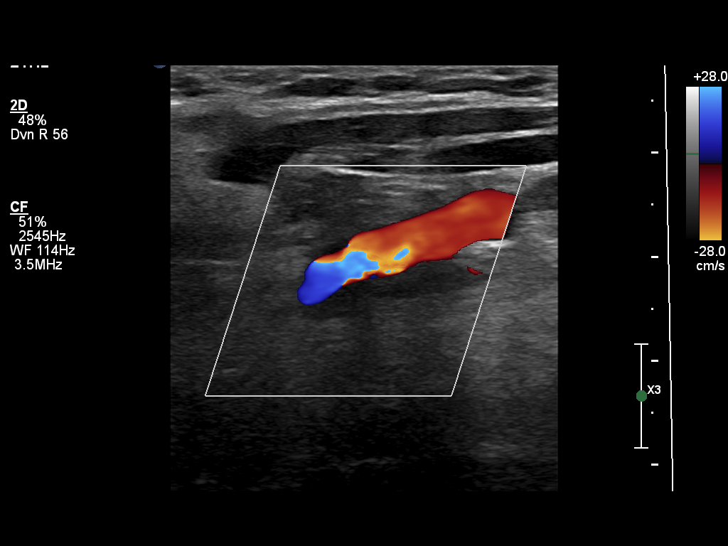
[im 61/66]
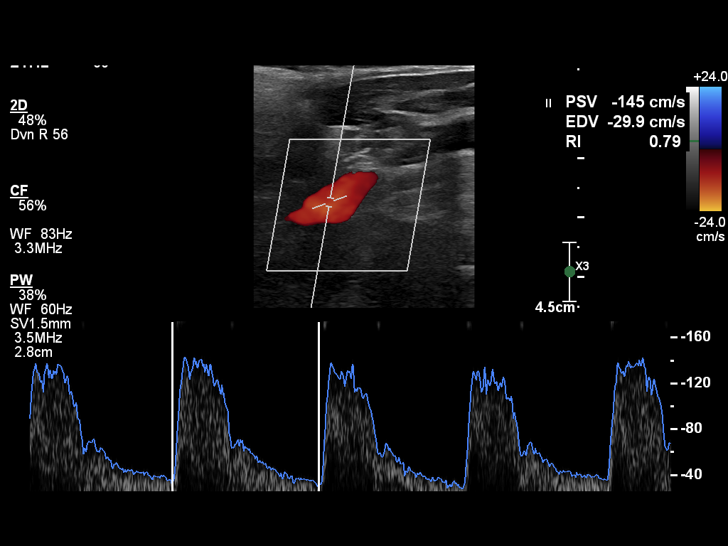
[im 66/66]
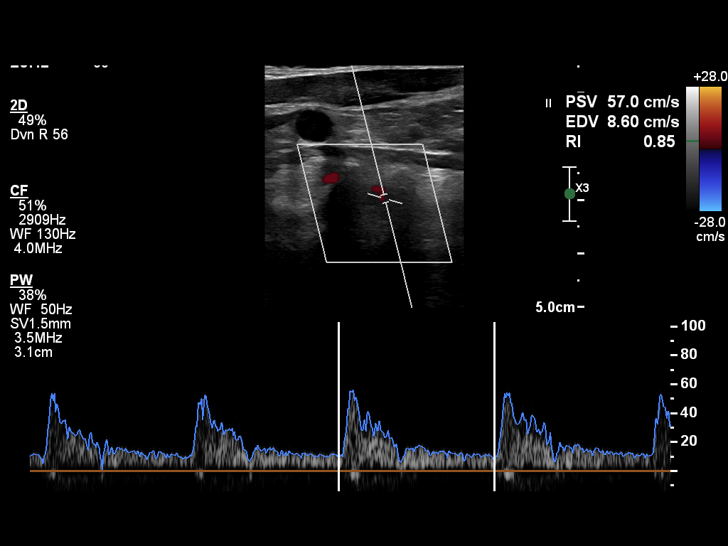

[14 of 16 positions shown; findings below may reference images not displayed]

DIAGNOSTIC STUDIES

EXAM

DUPLEX SCAN OF EXTRACRANIAL ARTERIES; COMPLETE BILATERAL STUDY, CPT 03114

INDICATION

Stroke. TIA.

TECHNIQUE

NASCET criteria were utilized.

COMPARISONS

No priors available for comparison.

FINDINGS

Small amount of plaque within the carotid bulbs. No velocity elevation. [Antegrade flow within the
vertebral arteries.]

IMPRESSION

1. No hemodynamically significant stenosis within the internal carotid arteries (0 to 49%).

2. Antegrade flow within the vertebral arteries.

Tech Notes:

## 2020-08-01 ENCOUNTER — Encounter: Admit: 2020-08-01 | Discharge: 2020-08-01

## 2020-08-01 DIAGNOSIS — I639 Cerebral infarction, unspecified: Secondary | ICD-10-CM

## 2020-08-18 ENCOUNTER — Encounter: Admit: 2020-08-18 | Discharge: 2020-08-18 | Payer: MEDICARE

## 2020-08-21 ENCOUNTER — Encounter: Admit: 2020-08-21 | Discharge: 2020-08-21 | Payer: MEDICARE

## 2020-08-21 DIAGNOSIS — E785 Hyperlipidemia, unspecified: Secondary | ICD-10-CM

## 2020-08-21 DIAGNOSIS — K219 Gastro-esophageal reflux disease without esophagitis: Secondary | ICD-10-CM

## 2020-08-21 DIAGNOSIS — I10 Essential (primary) hypertension: Secondary | ICD-10-CM

## 2020-08-21 DIAGNOSIS — G459 Transient cerebral ischemic attack, unspecified: Secondary | ICD-10-CM

## 2020-08-21 DIAGNOSIS — J449 Chronic obstructive pulmonary disease, unspecified: Secondary | ICD-10-CM

## 2020-08-25 ENCOUNTER — Ambulatory Visit: Admit: 2020-08-25 | Discharge: 2020-08-25 | Payer: MEDICARE

## 2020-08-25 ENCOUNTER — Encounter: Admit: 2020-08-25 | Discharge: 2020-08-25 | Payer: MEDICARE

## 2020-08-25 DIAGNOSIS — R002 Palpitations: Secondary | ICD-10-CM

## 2020-08-25 DIAGNOSIS — G459 Transient cerebral ischemic attack, unspecified: Secondary | ICD-10-CM

## 2020-08-25 DIAGNOSIS — K219 Gastro-esophageal reflux disease without esophagitis: Secondary | ICD-10-CM

## 2020-08-25 DIAGNOSIS — I639 Cerebral infarction, unspecified: Secondary | ICD-10-CM

## 2020-08-25 DIAGNOSIS — J449 Chronic obstructive pulmonary disease, unspecified: Secondary | ICD-10-CM

## 2020-08-25 DIAGNOSIS — E785 Hyperlipidemia, unspecified: Secondary | ICD-10-CM

## 2020-08-25 DIAGNOSIS — I10 Essential (primary) hypertension: Secondary | ICD-10-CM

## 2020-08-25 NOTE — Progress Notes
Ordering Physician:Love, W.   Type of Monitor:Auto-trigger looping  Length of Study:30 days  Dx:CVA, palpitations  Home enrolled

## 2020-08-26 IMAGING — MG MAMMOGRAM 3D SCREEN, BILATERAL
8 series · 8 of 8 positions shown · non-contrast
Comparison: none

[R CC]
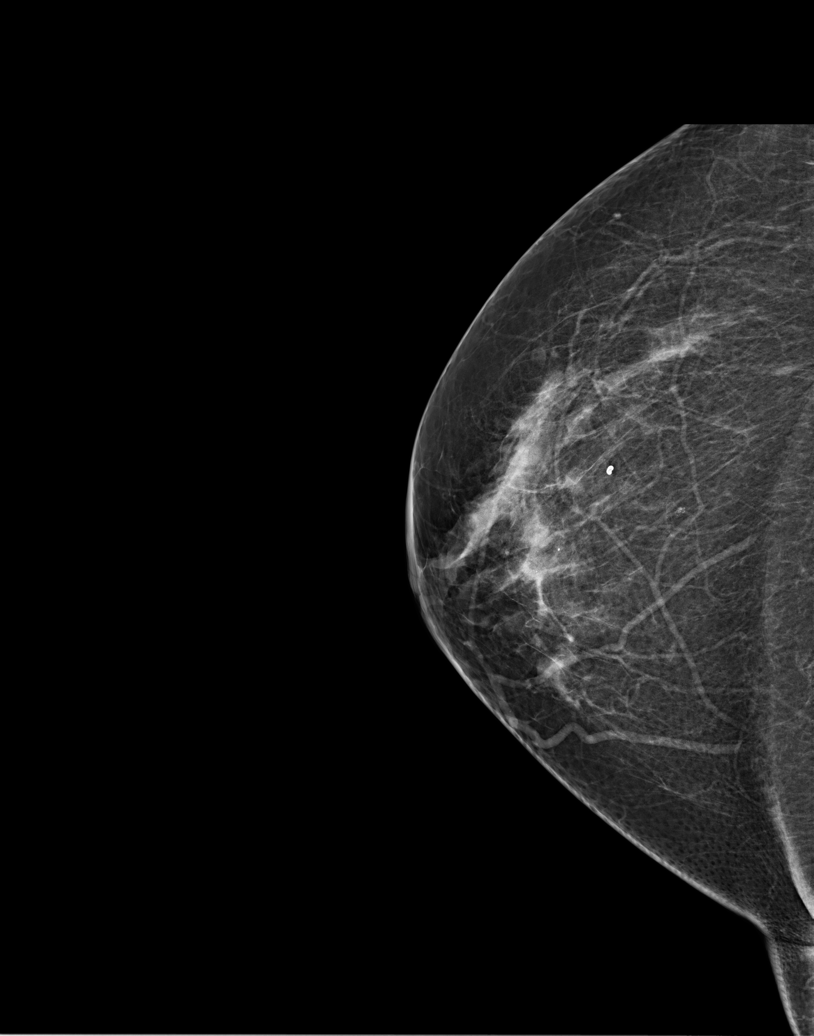

[R tomo (1 of 2)]
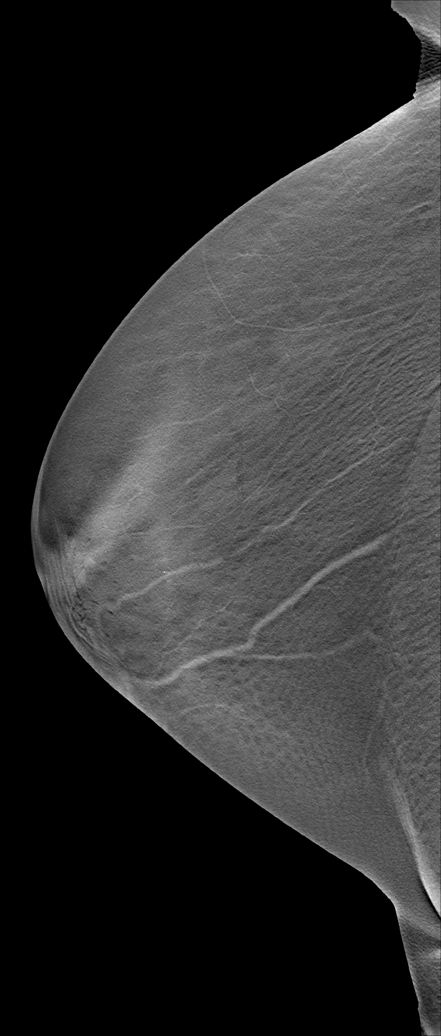

[R MLO]
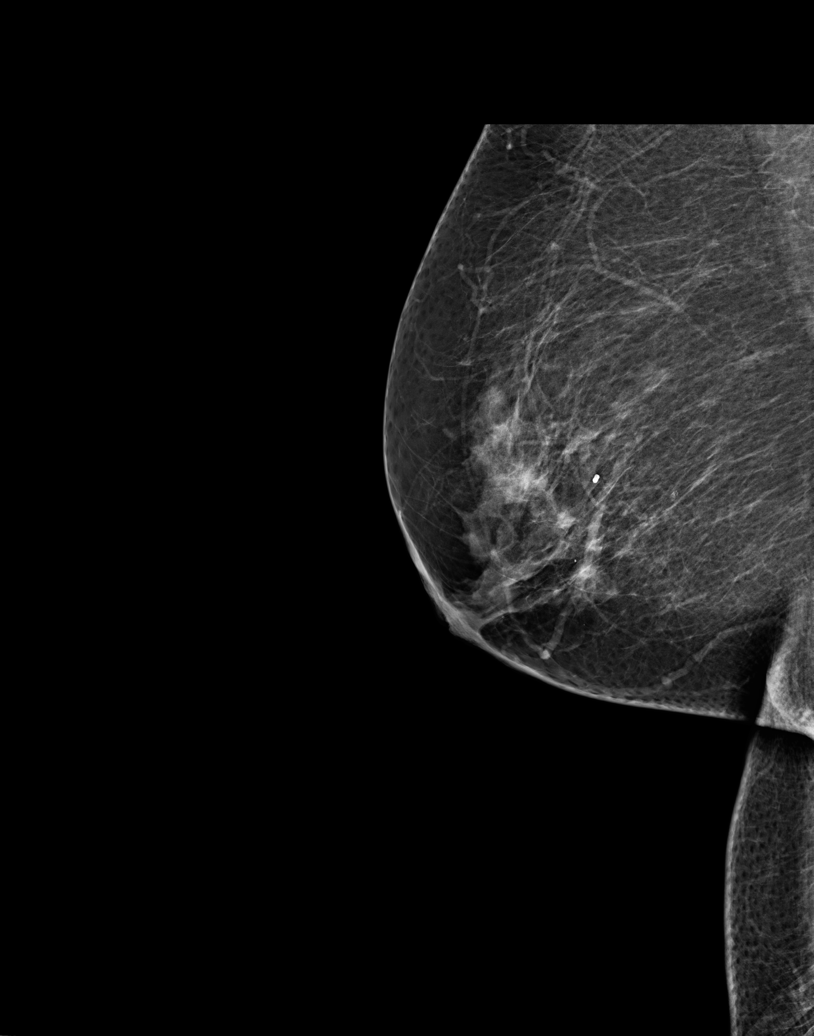

[R tomo (2 of 2)]
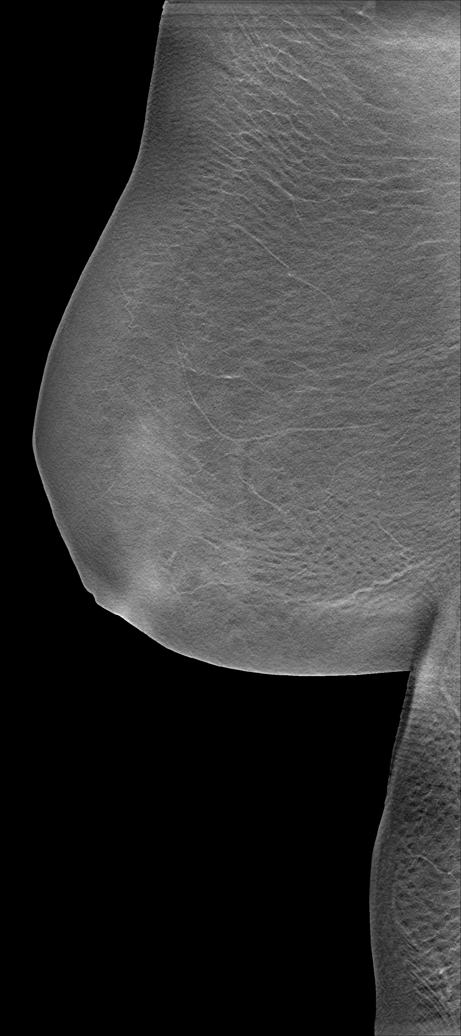

[L MLO]
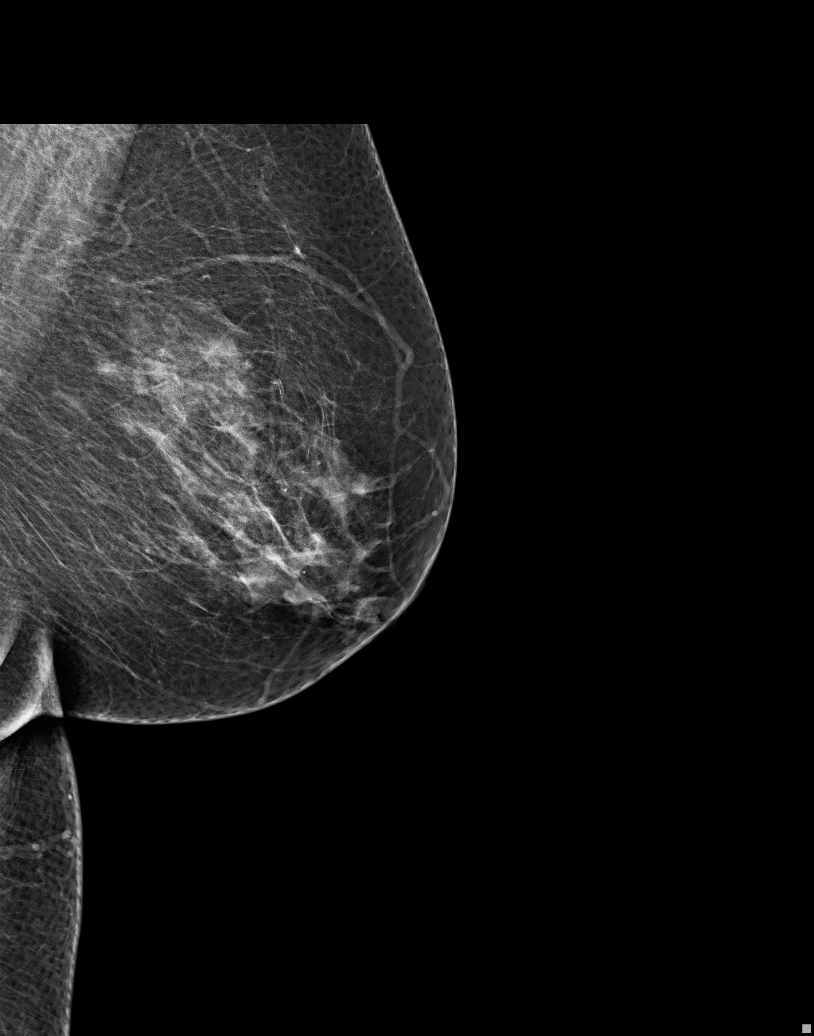

[L tomo (1 of 2)]
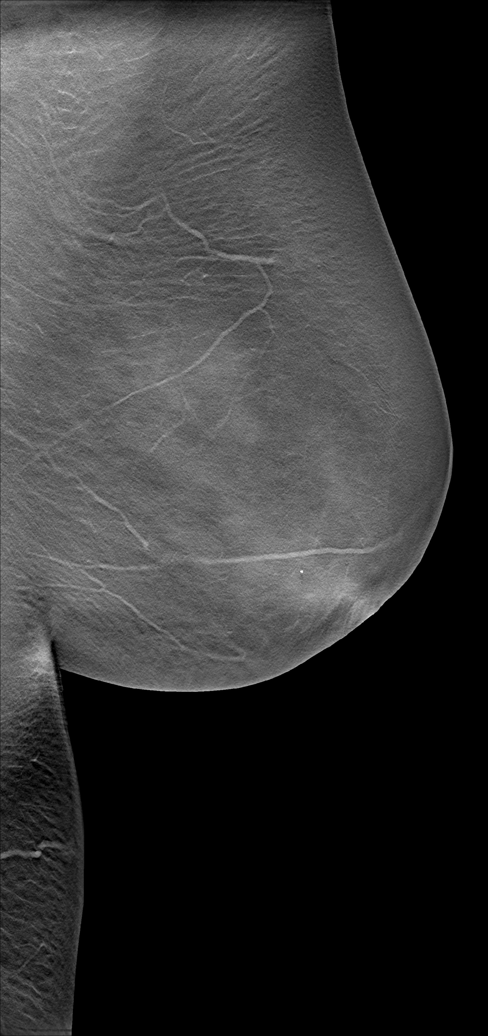

[L CC]
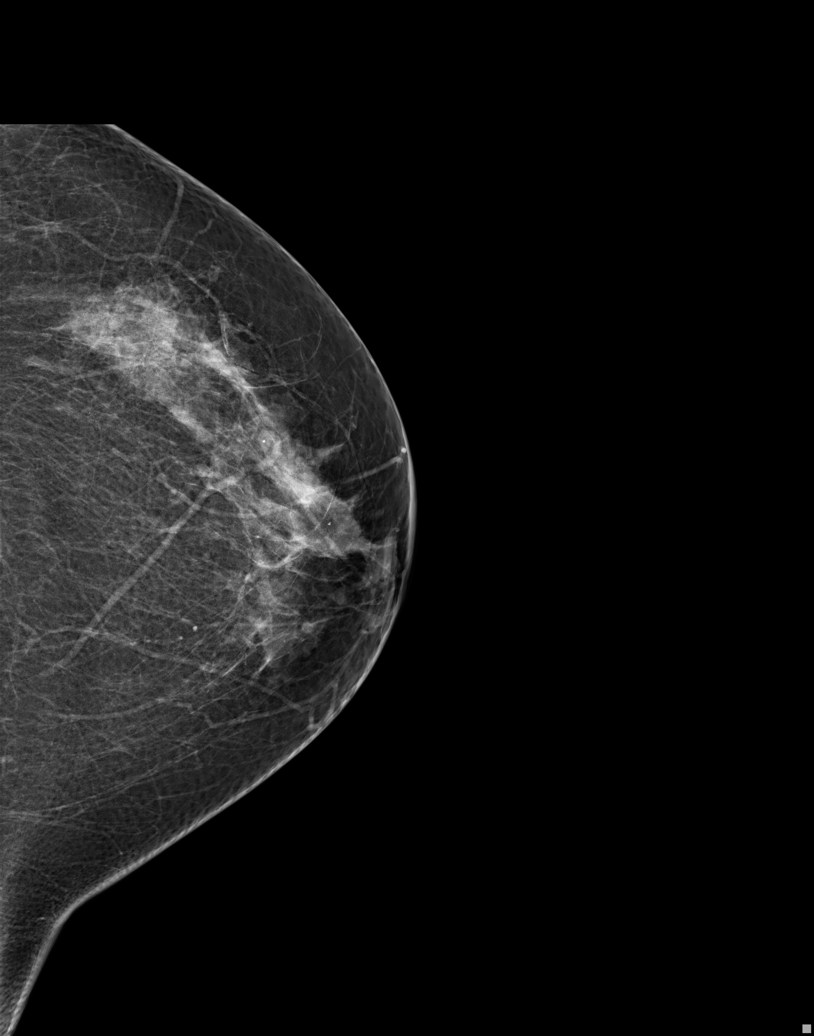

[L tomo (2 of 2)]
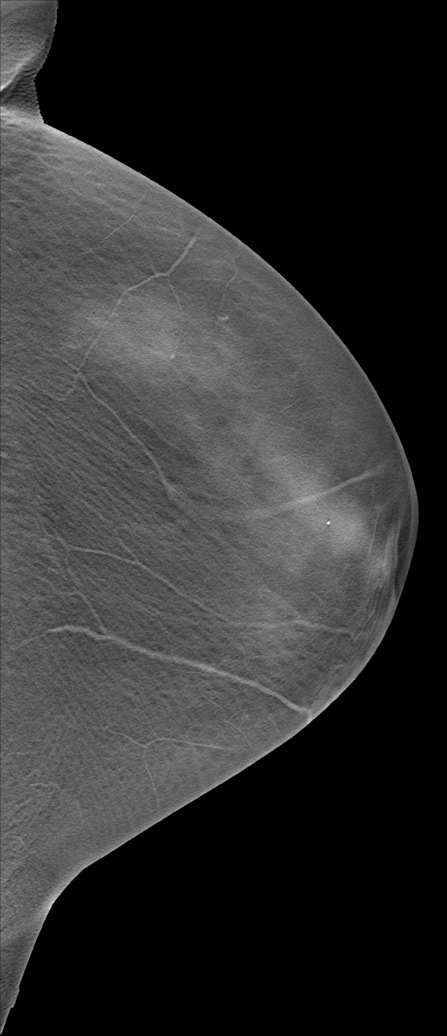

[8 of 8 positions shown; findings below may reference images not displayed]

EXAM

Digital screening mammography with breast tomosynthesis

INDICATION

screening
SCREENING. 20LB WT LOSS SINCE LAST MAMMOGRAM. 3D KF. PRIORS [id number]

FINDINGS

The prior study was reviewed from 07/22/2019.

Digital 2D CC and MLO projections obtained with 3D tomographic views per manufacturer's protocol.
ICAD version 7.2 was used during this exam.

There are scattered areas of fibroglandular density bilaterally. There are scattered benign
calcifications bilaterally which appear unchanged. There are no dominant masses or suspicious
calcifications. There has been no significant change from the prior study.

IMPRESSION

Benign mammogram with breast tomosynthesis. BI-RADS 2. Screening mammography in 12 months is
recommended.

A letter regarding results and followup scheduling will be sent.

Tech Notes:

## 2020-09-30 ENCOUNTER — Encounter: Admit: 2020-09-30 | Discharge: 2020-09-30 | Payer: MEDICARE

## 2020-09-30 NOTE — Telephone Encounter
-----   Message from Altamease Oiler, MD sent at 09/29/2020  5:03 PM CDT -----  Would you mind reaching out to [facility] and letting her know that her cardiac monitor overall looked good.  There was some questionable arrhythmia but overall was not too concerning.  I like for her to watch her symptoms of palpitations.  If these are occurring more frequently we could consider an implantable looping monitor to rule out atrial fibrillation completely.    Thanks!

## 2021-07-16 IMAGING — CT CHEST WO(Adult)
2 of 6 series · 15 of 36 positions shown, 18 images · non-contrast
Comparison: none

[Series 4: thorax cor 1.50 br40 s3 · coronal · 0.63mm/px · 3 of 192 slices shown]
[im 39/192  lung]
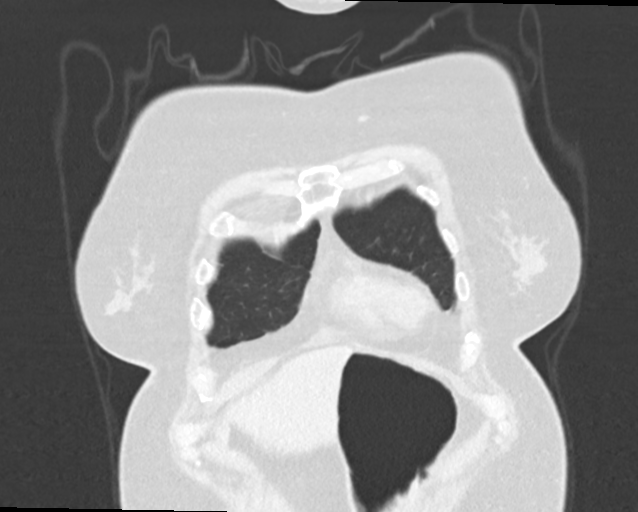
[im 77/192  lung]
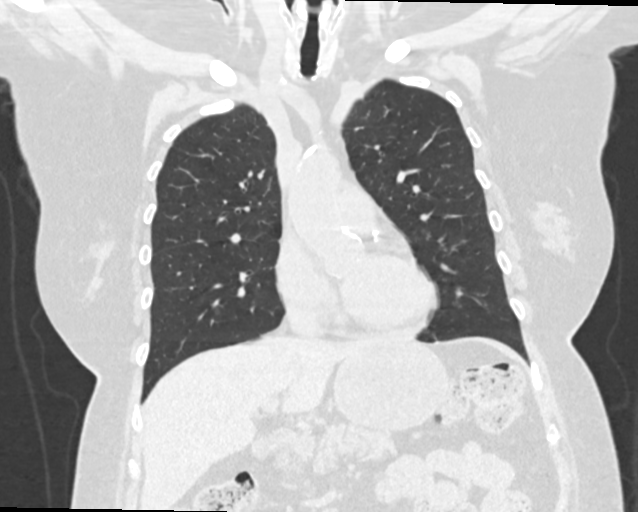
[im 115/192  lung]
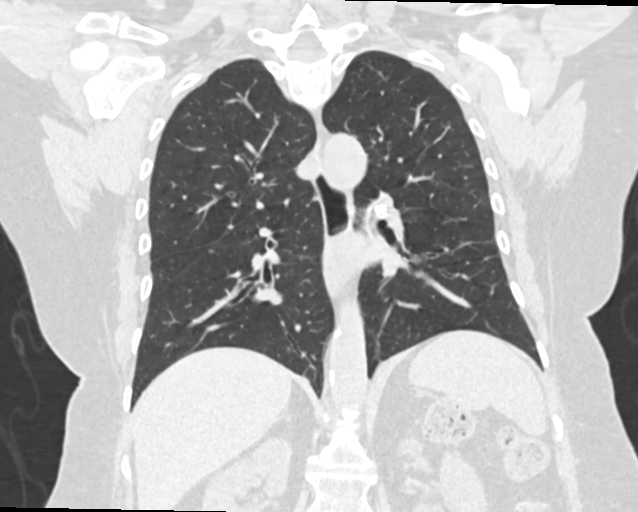

[Series 11: thorax 1.00 br60 s3 · axial · 0.75mm/px · z∈[+1574,+1852]mm · 12 of 457 slices shown, 15 images]
[im 33/457  mediastinal]
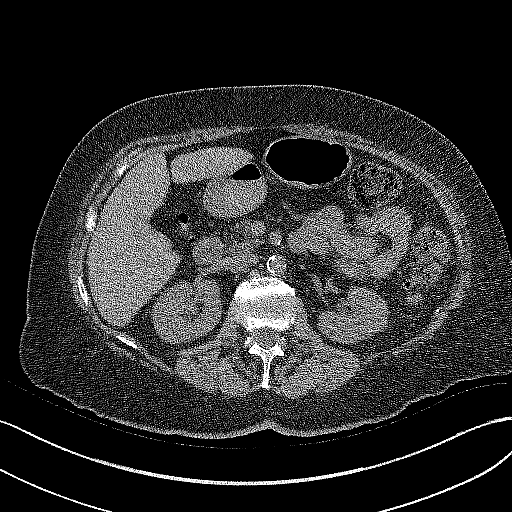
[im 33/457  lung]
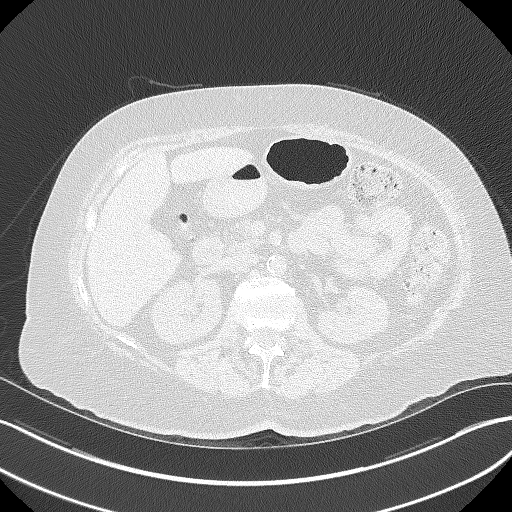
[im 66/457  lung]
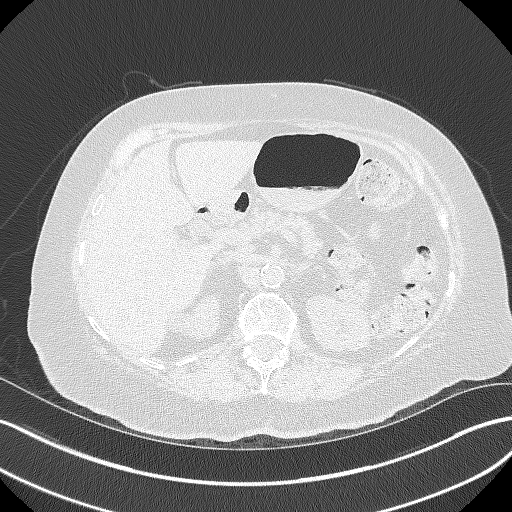
[im 98/457  lung]
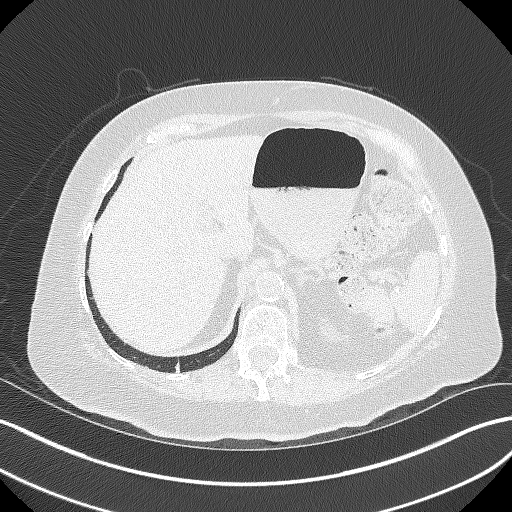
[im 131/457  lung]
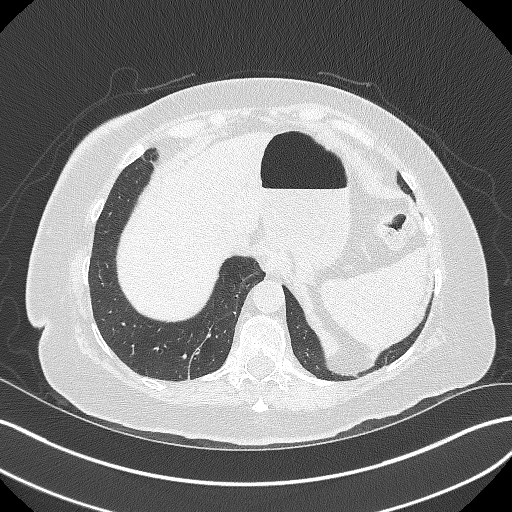
[im 163/457  mediastinal]
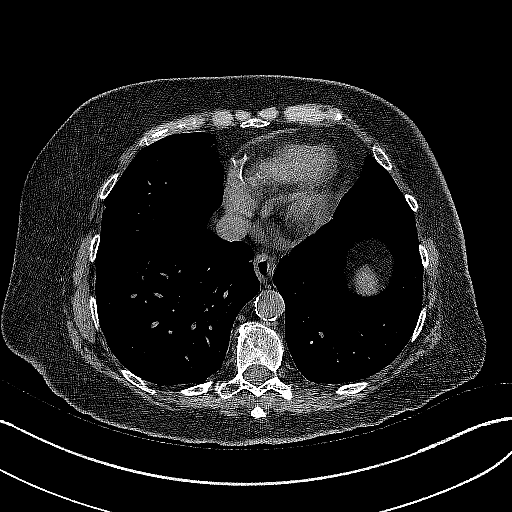
[im 163/457  lung]
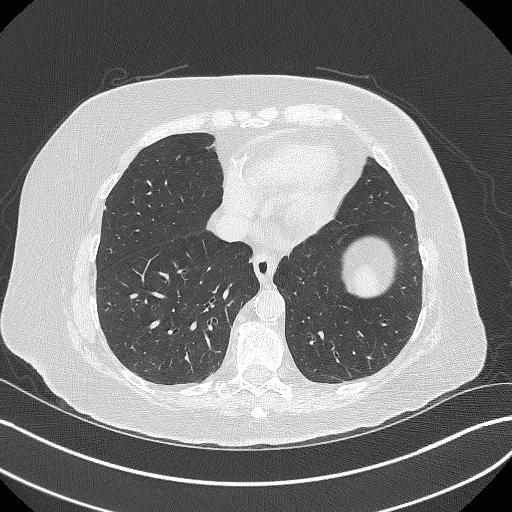
[im 196/457  lung]
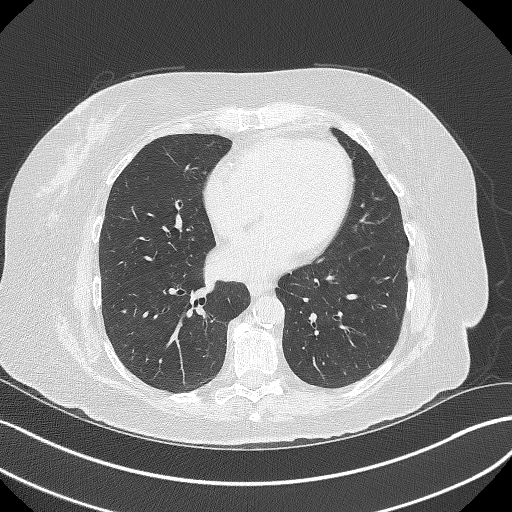
[im 261/457  lung]
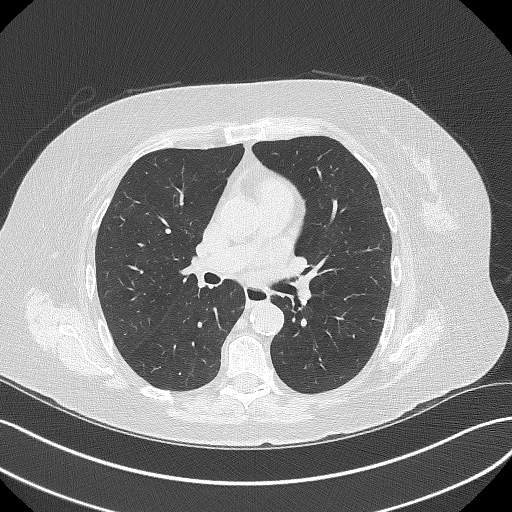
[im 294/457  lung]
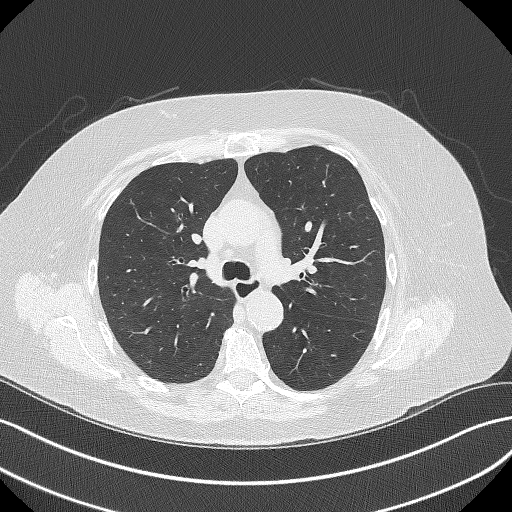
[im 326/457  mediastinal]
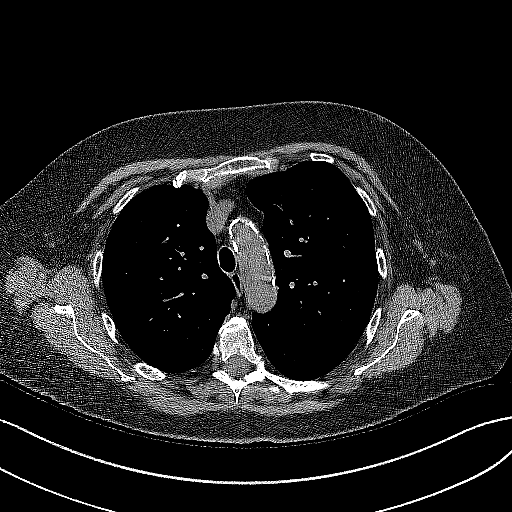
[im 326/457  lung]
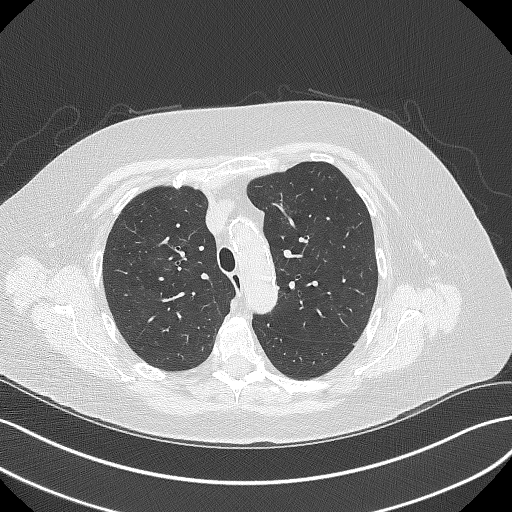
[im 359/457  lung]
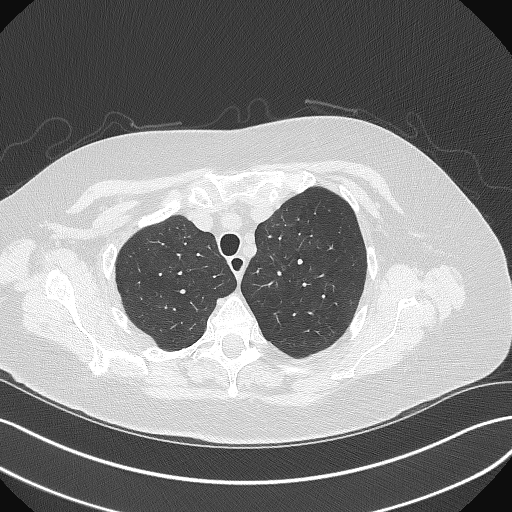
[im 391/457  lung]
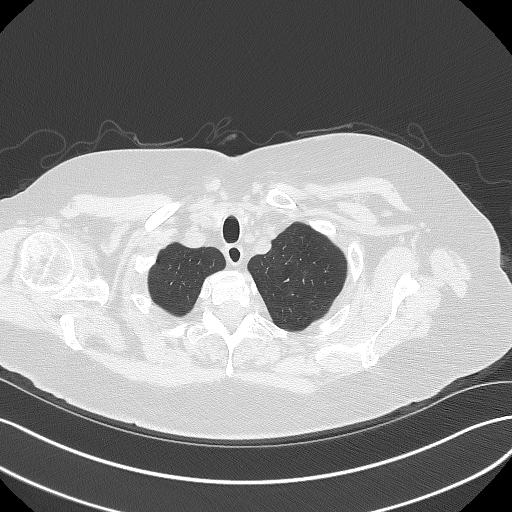
[im 424/457  lung]
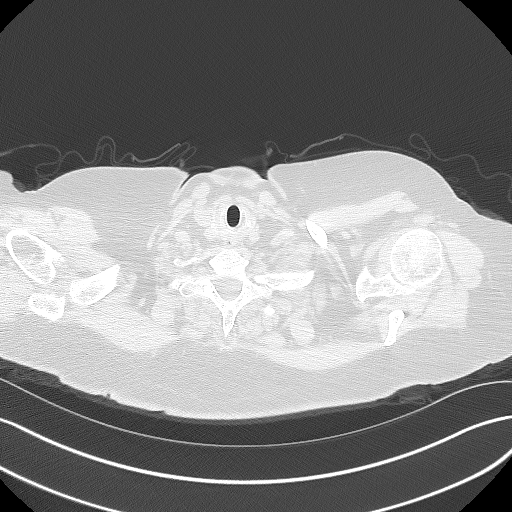

[15 of 36 positions shown; findings below may reference images not displayed]

DIAGNOSTIC STUDIES

EXAM

COMPUTED TOMOGRAPHY, THORAX; WITHOUT CONTRAST MATERIAL CPT 20094

INDICATION

copd
COPD. Pt c/o shallow breathing. Pt states quit smoking in 2446. Pt denies sx hx. CT/NM 1/0. CF

TECHNIQUE

Contiguous axial tomographic images were obtained from the lung apices to the upper abdomen without
intravenous contrast. Coronal and sagittal reformatted images were obtained.

All CT scans at this facility use dose modulation, iterative reconstruction, and/or weight based
dosing when appropriate to reduce radiation dose to as low as reasonably achievable.

COMPARISONS

No priors available for comparison.

FINDINGS

The heart appears normal in size. No pericardial effusion. Large amount of calcified plaque within
the coronary arteries. Motion artifact at the aortic root. Otherwise, no definite aortic aneurysm or
dissection. Calcified left hilar nodes. No pathologically enlarged axillary nodes. Mild thickening
of the distal esophagus. Centrilobular emphysematous changes. 2 millimeter left upper lobe nodule,
image 50. 2 millimeter left upper lobe nodule, image 66. Mild bibasilar atelectasis versus scarring.
3 millimeter pleural-based right lower lobe nodule, image 85. 2 millimeter right upper lobe nodule,
image 74. There is a 5 millimeter nodule contiguous with the minor fissure, image 110. No
pneumoperitoneum. Cholecystectomy. The adrenal glands appear grossly unremarkable. Probable cyst
within the upper pole of the right kidney. No acute displaced fracture.

IMPRESSION

1. COPD. No acute consolidation.

Tech Notes:

COPD. Pt c/o shallow breathing. Pt states quit smoking in 2446. Pt denies sx hx. CT/NM 1/0. CF

## 2021-07-20 ENCOUNTER — Encounter: Admit: 2021-07-20 | Discharge: 2021-07-20 | Payer: MEDICARE

## 2021-07-20 DIAGNOSIS — E785 Hyperlipidemia, unspecified: Secondary | ICD-10-CM

## 2021-07-20 DIAGNOSIS — K219 Gastro-esophageal reflux disease without esophagitis: Secondary | ICD-10-CM

## 2021-07-20 DIAGNOSIS — I10 Essential (primary) hypertension: Secondary | ICD-10-CM

## 2021-07-20 IMAGING — CT CALCIUM_SCORING(Adult)
2 series · 16 of 20 positions shown, 18 images · non-contrast
Comparison: none

[Series 2: calcium scoring 3.00 qr36 bestdiast 65% · axial · 0.35mm/px · z∈[+1779,+1886]mm · 8 of 91 slices shown, 10 images]
[im 11/91  vessel]
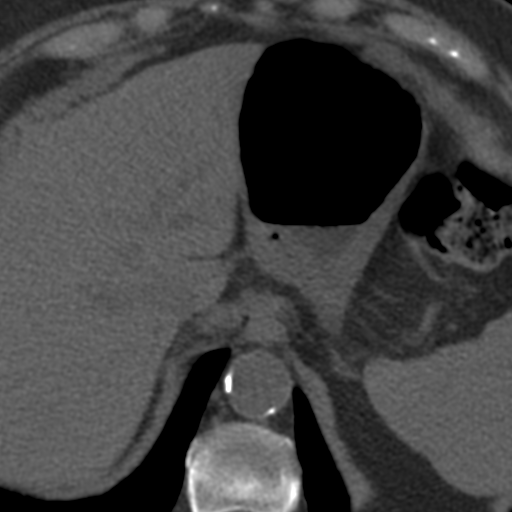
[im 11/91  lung]
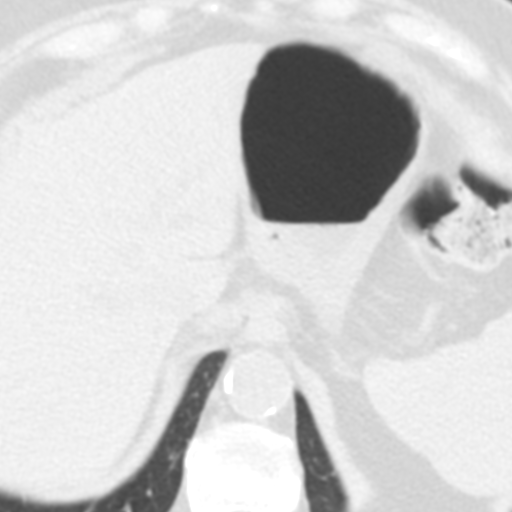
[im 21/91  vessel]
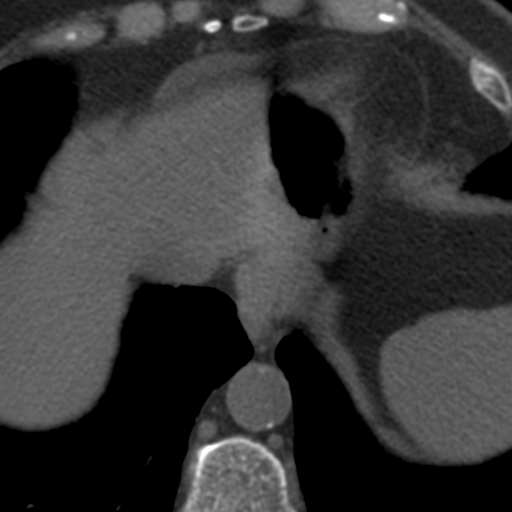
[im 31/91  vessel]
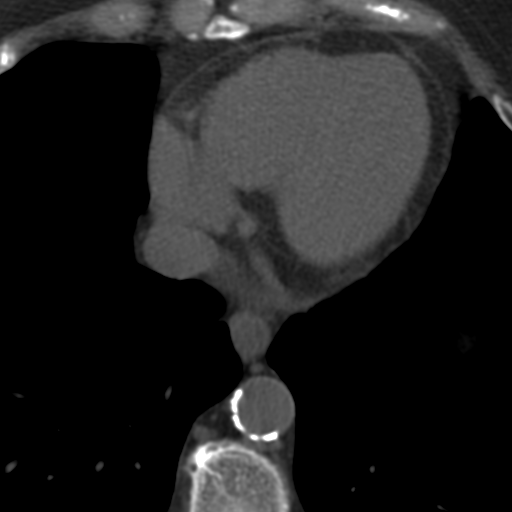
[im 41/91  vessel]
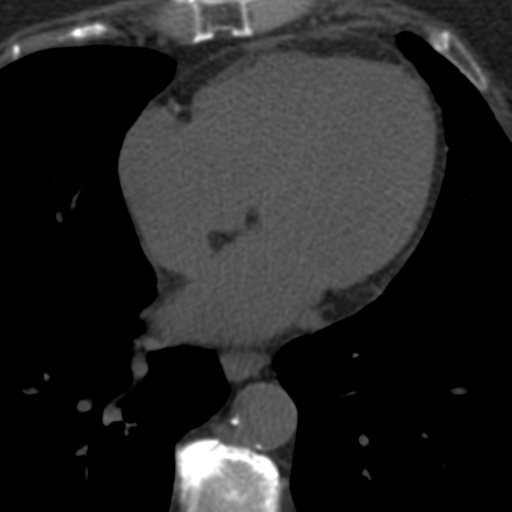
[im 51/91  vessel]
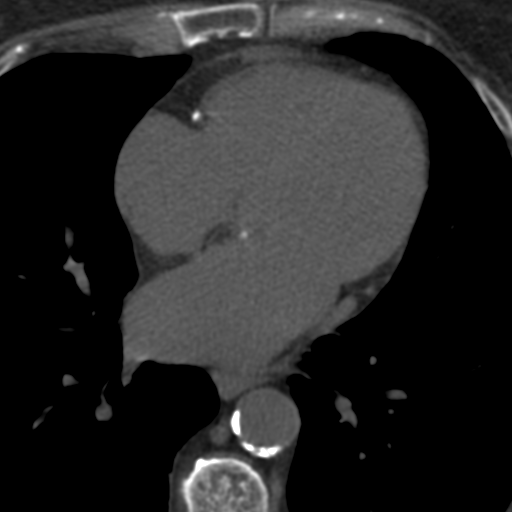
[im 51/91  lung]
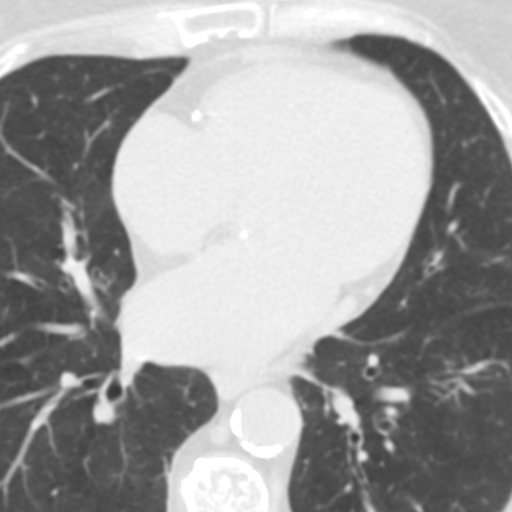
[im 61/91  vessel]
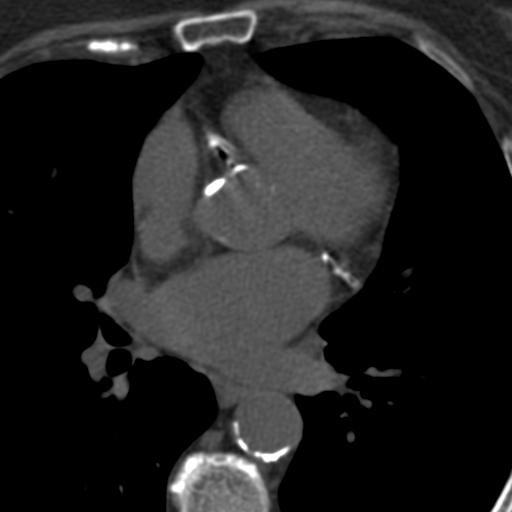
[im 71/91  vessel]
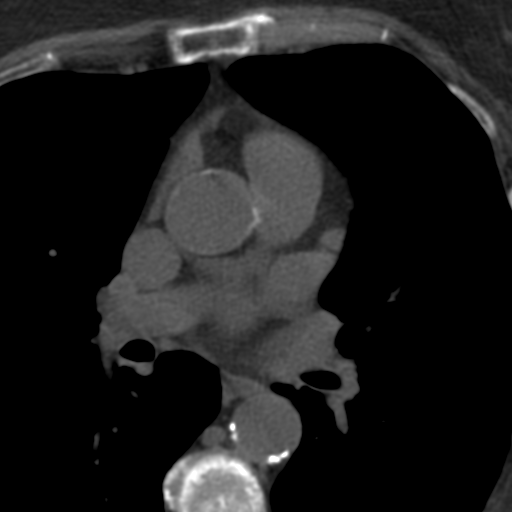
[im 81/91  vessel]
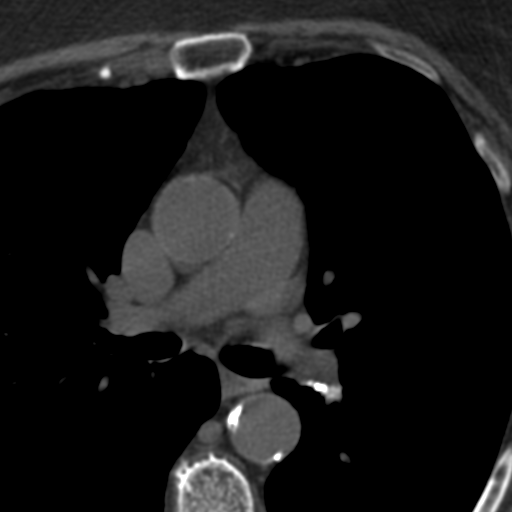

[Series 3: calcium scoring 1.50 br60 bestdiast 65% lung · axial · 0.35mm/px · z∈[+1778,+1886]mm · 8 of 91 slices shown]
[im 11/91  lung]
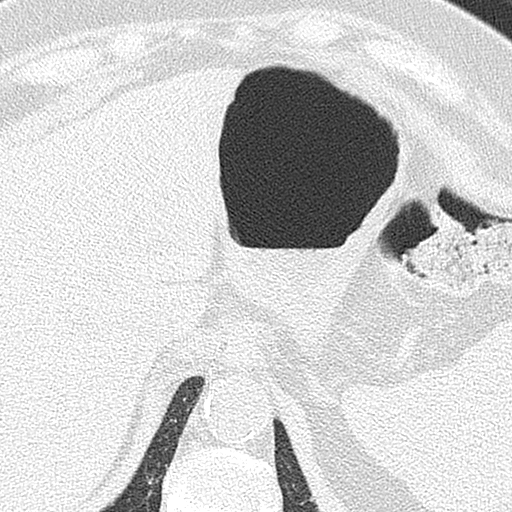
[im 21/91  lung]
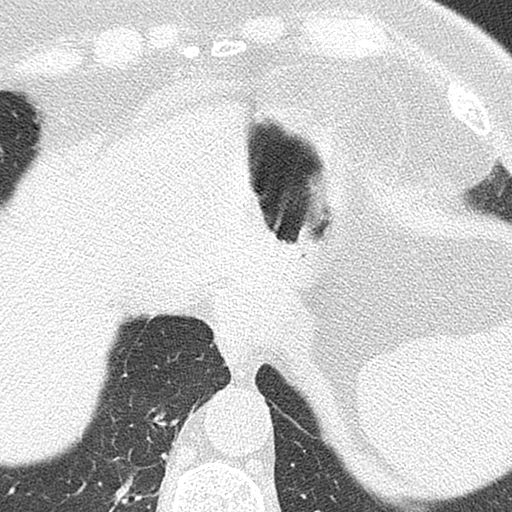
[im 31/91  lung]
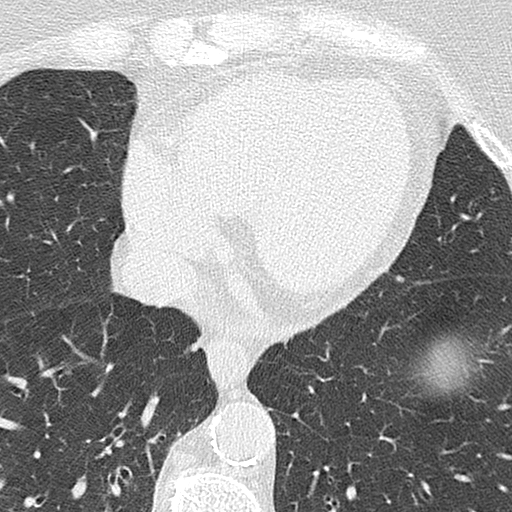
[im 41/91  lung]
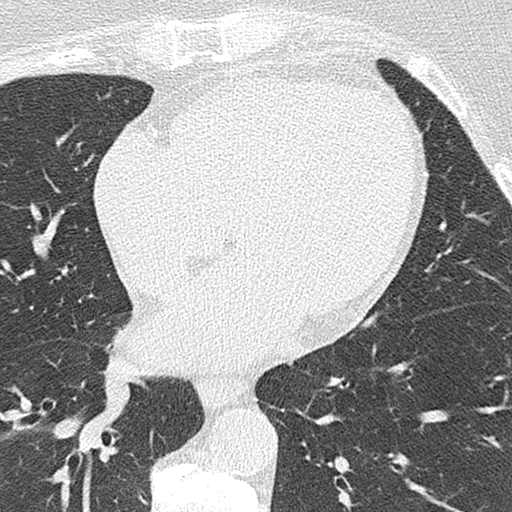
[im 51/91  lung]
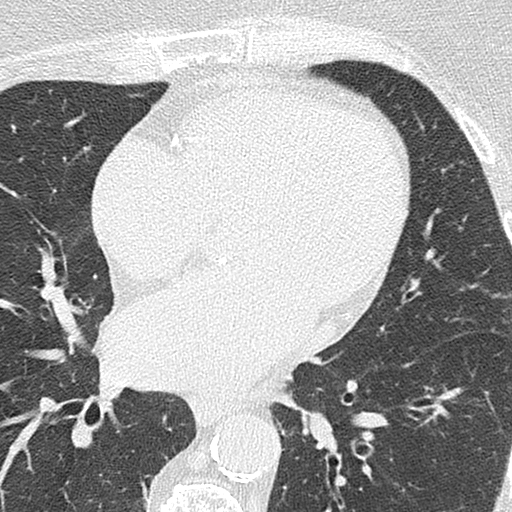
[im 61/91  lung]
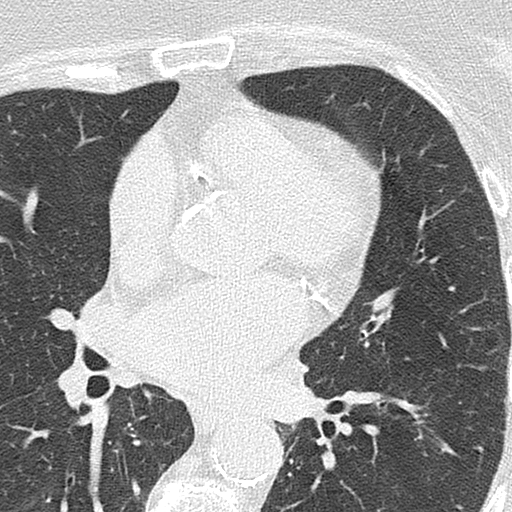
[im 71/91  lung]
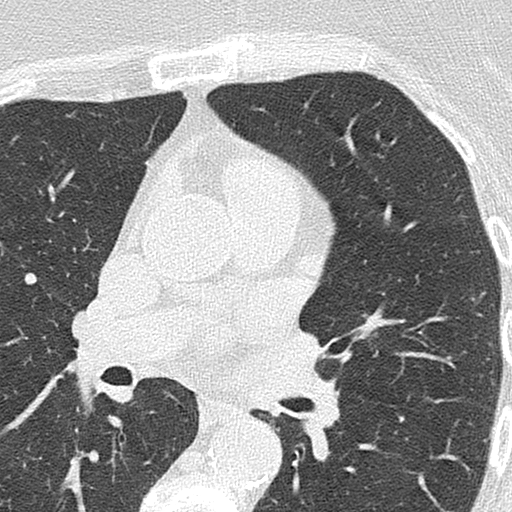
[im 81/91  lung]
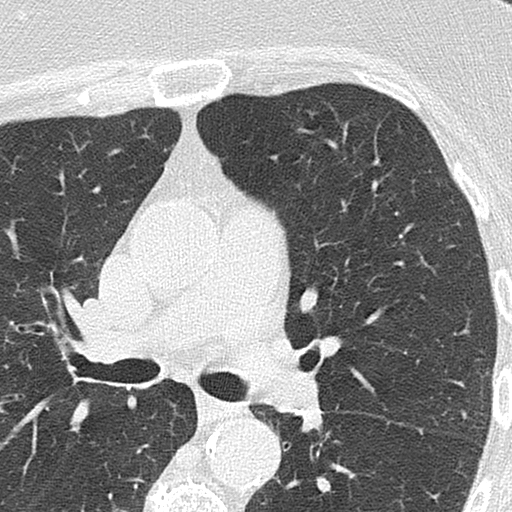

[16 of 20 positions shown; findings below may reference images not displayed]

DIAGNOSTIC STUDIES

EXAM

CT scan of the heart for coronary calcium scoring.

INDICATION

ALL TO EVALUATE CALCIFIED PLAQUE BUILDUP. PATIENT STATES HISTORY OF HTN AND HYPERLIPIDEMIA. CT/NM

TECHNIQUE

Multiple, contiguous transaxial images were obtained through the chest with particular attention to
the heart for coronary calcium scoring. Calcium scoring was calculated utilizing coronary calcium
scoring software.

All CT scans at this facility use dose modulation, iterative reconstruction, and/or weight based
dosing when appropriate to reduce radiation dose to as low as reasonably achievable.

COMPARISONS

Reference is made to CT scan of the chest without contrast material dated 07/16/2021.

Number of previous computed tomography exams in the last 12 months is 2.

Number of previous nuclear medicine myocardial perfusion studies in the last 12 months is 0 .

FINDINGS

Coronary calcium scoring was calculated at  6006.7. Values are sub divided as follows:

Left main coronary artery  356.2.

Left anterior descending coronary artery  265.6.

Left circumflex coronary artery  57.5.

Right coronary artery 542.4.

Posterior descending coronary artery 0.

Total  6006.7.

Lung windows through the visualized aspects of the lungs did not demonstrate any suspicious
pulmonary nodules. There is thoracic spondylosis.

IMPRESSION

Coronary calcium score of  1221.7 places the patient in the extensive plaque burden category with a
very high cardiovascular disease risk. This places the patient in the  99th percentile. Cardiology
consultation is recommended.

Tech Notes:

PT STATES HX OF HTN AND HYPERLIPIDEMIA. CT/NM 2/0. TJ

## 2021-07-22 ENCOUNTER — Encounter: Admit: 2021-07-22 | Discharge: 2021-07-22 | Payer: MEDICARE

## 2021-07-22 ENCOUNTER — Ambulatory Visit: Admit: 2021-07-22 | Discharge: 2021-07-23 | Payer: MEDICARE

## 2021-07-22 DIAGNOSIS — E785 Hyperlipidemia, unspecified: Secondary | ICD-10-CM

## 2021-07-22 DIAGNOSIS — K219 Gastro-esophageal reflux disease without esophagitis: Secondary | ICD-10-CM

## 2021-07-22 DIAGNOSIS — G459 Transient cerebral ischemic attack, unspecified: Secondary | ICD-10-CM

## 2021-07-22 DIAGNOSIS — I10 Essential (primary) hypertension: Secondary | ICD-10-CM

## 2021-07-22 DIAGNOSIS — J449 Chronic obstructive pulmonary disease, unspecified: Secondary | ICD-10-CM

## 2021-07-22 NOTE — Progress Notes
Date of Service: 07/22/2021    LASHIA FREELAND is a 71 y.o. female.       HPI     Patient is a 71 year old Caucasian female past medical history of hyperlipidemia, hypertension that is on chronic medical therapy.  She has a history of significant gastroesophageal reflux disease with Barrett's esophagus and previous hiatal hernia status post Nissen fundoplication.  She had a lacunar cerebrovascular accident in June 2022.  She has no residual neurological deficits, evaluation after that event included carotid ultrasound that did not show significant plaque or abnormalities, 30-day event recorder did not reveal significant arrhythmia and rare premature contractions.  She denies any palpitations, lightheadedness, or syncopal type episodes.  States that she has been having intermittent discomfort in her chest especially when she is doing things that are more active or with increased stress.  It is fairly lower in the mid sternum to epigastric region.  Had 1 severe episode where she felt kind of gurgly and then vomited.  They reassessed her upper GI tract with a endoscopy did not reveal significant abnormality and reported stable findings from her previous surgical intervention.  Recently underwent coronary artery calcium score that was elevated at greater than 1200.  She continues to walk occasionally, is trying to eat healthier.  Reports her husband had a similar study and also had an increase score, he had a heart cath did not reveal obstructive disease.         Vitals:    07/22/21 1147   BP: 138/68   BP Source: Arm, Left Upper   Pulse: 64   SpO2: 97%   O2 Percent: 97 %   O2 Device: None (Room air)   PainSc: Zero   Weight: 76.7 kg (169 lb 3.2 oz)   Height: 157.5 cm (5' 2)     Body mass index is 30.95 kg/m?Marland Kitchen     Past Medical History  Patient Active Problem List    Diagnosis Date Noted   ? Hyperlipidemia 08/21/2020   ? COPD (chronic obstructive pulmonary disease) (HCC) 08/21/2020   ? Primary hypertension 08/21/2020   ? GERD (gastroesophageal reflux disease) 08/21/2020   ? CVA (cerebral vascular accident) (HCC) 08/21/2020     07/30/20 Acute lacunar stroke within lt and rt occipital lobe.           Review of Systems   Constitutional: Negative.   HENT: Positive for tinnitus.    Eyes: Positive for blurred vision.   Cardiovascular: Positive for dyspnea on exertion, orthopnea and paroxysmal nocturnal dyspnea.   Respiratory: Positive for shortness of breath.    Endocrine: Negative.    Hematologic/Lymphatic: Negative.    Skin: Negative.    Musculoskeletal: Positive for back pain and neck pain.   Gastrointestinal: Positive for bloating, abdominal pain, constipation and diarrhea.   Genitourinary: Negative.    Neurological: Positive for light-headedness.   Psychiatric/Behavioral: Negative.    Allergic/Immunologic: Negative.        Physical Exam  Awake alert no acute distress, appears well-nourished and appropriate age  Pupils are equal react without scleral injection  Neck is supple normal carotid upstroke and no bruits, masses, or jugular venous abnormalities  Lungs are clear to auscultation with good effort and symmetric rise and fall the chest  Heart S1, S2 is normal.  No murmurs, clicks, or gallops  Abdomen is obese but soft and nontender  Pulse are 2+, regular, and symmetric bilaterally at radial as well as dorsalis pedis and posterior tibial locations  No significant  peripheral edema with normal skin turgor    Cardiovascular Studies  Reviewed coronary artery calcium score and previous event recorder    Cardiovascular Health Factors  Vitals BP Readings from Last 3 Encounters:   07/22/21 138/68   08/25/20 126/80   08/04/20 (!) 155/74     Wt Readings from Last 3 Encounters:   07/22/21 76.7 kg (169 lb 3.2 oz)   08/25/20 75.8 kg (167 lb)   08/04/20 76.7 kg (169 lb)     BMI Readings from Last 3 Encounters:   07/22/21 30.95 kg/m?   08/25/20 30.54 kg/m?   08/04/20 30.91 kg/m?      Smoking Social History     Tobacco Use   Smoking Status Former ? Types: Cigarettes   ? Quit date: 08/25/1996   ? Years since quitting: 24.9   Smokeless Tobacco Never      Lipid Profile Cholesterol   Date Value Ref Range Status   07/31/2020 131  Final     HDL   Date Value Ref Range Status   07/31/2020 46  Final     LDL   Date Value Ref Range Status   07/31/2020 75  Final     Triglycerides   Date Value Ref Range Status   07/31/2020 51  Final      Blood Sugar No results found for: HGBA1C  Glucose   Date Value Ref Range Status   07/31/2020 85  Final   07/30/2020 103  Final          Problems Addressed Today  Encounter Diagnoses   Name Primary?   ? Hyperlipidemia, unspecified hyperlipidemia type    ? Primary hypertension    ? Gastroesophageal reflux disease, unspecified whether esophagitis present        Assessment and Plan     Chest pain symptoms do have exertional component there is relief by rest or avoidance of stress.  GI etiology has been evaluated and found to be negative.  Risk factors for coronary artery disease include known coronary artery calcium, hypertension, and hyperlipidemia.  Remote history of smoking is also noted.  Medical therapy is appropriate.  We will evaluate the chest pain further with treadmill stress test.  We will notify patient once we have the results and determine if further therapy or testing is warranted at that time.         Current Medications (including today's revisions)  ? acetaminophen (TYLENOL EXTRA STRENGTH) 500 mg tablet Take one tablet by mouth every 6 hours as needed for Pain. Max of 4,000 mg of acetaminophen in 24 hours.   ? albuterol sulfate (PROAIR HFA) 90 mcg/actuation HFA aerosol inhaler Inhale two puffs by mouth into the lungs every 4-6 hours as needed for Wheezing or Shortness of Breath. Shake well before use.   ? aspirin EC 81 mg tablet Take one tablet by mouth daily. Take with food.   ? azelastine (ASTELIN) 137 mcg (0.1 %) nasal spray Apply two sprays to each nostril as directed twice daily as needed. Use in each nostril as directed   ? hydroCHLOROthiazide (HYDRODIURIL) 25 mg tablet Take one tablet by mouth daily.   ? losartan (COZAAR) 100 mg tablet Take one tablet by mouth daily.   ? polyethylene glycol 3350 (MIRALAX) 17 g packet Take one packet by mouth twice daily as needed.   ? pravastatin (PRAVACHOL) 80 mg tablet Take one tablet by mouth at bedtime daily.   ? QVAR REDIHALER 80 mcg/actuation inhaler Inhale one puff by mouth  into the lungs daily.

## 2021-07-22 NOTE — Patient Instructions
Risk factors for heart disease are well addressed with current medication  Will arrange for treadmill stress test because of chest discomfort and shortness of breath  Will notify of results and follow up based on findings

## 2021-07-23 ENCOUNTER — Encounter: Admit: 2021-07-23 | Discharge: 2021-07-23 | Payer: MEDICARE

## 2021-07-23 ENCOUNTER — Ambulatory Visit: Admit: 2021-07-23 | Discharge: 2021-07-23 | Payer: MEDICARE

## 2021-07-23 DIAGNOSIS — K219 Gastro-esophageal reflux disease without esophagitis: Secondary | ICD-10-CM

## 2021-07-23 DIAGNOSIS — E785 Hyperlipidemia, unspecified: Secondary | ICD-10-CM

## 2021-07-23 DIAGNOSIS — I10 Essential (primary) hypertension: Secondary | ICD-10-CM

## 2021-07-27 ENCOUNTER — Encounter: Admit: 2021-07-27 | Discharge: 2021-07-27 | Payer: MEDICARE

## 2021-07-27 DIAGNOSIS — R0609 Other forms of dyspnea: Secondary | ICD-10-CM

## 2021-07-27 DIAGNOSIS — I2510 Atherosclerotic heart disease of native coronary artery without angina pectoris: Secondary | ICD-10-CM

## 2021-07-27 DIAGNOSIS — R9439 Abnormal result of other cardiovascular function study: Secondary | ICD-10-CM

## 2021-07-27 DIAGNOSIS — I10 Essential (primary) hypertension: Secondary | ICD-10-CM

## 2021-07-27 MED ORDER — ASPIRIN 325 MG PO TAB
325 mg | Freq: Once | ORAL | 0 refills
Start: 2021-07-27 — End: ?

## 2021-07-27 NOTE — Patient Instructions
CARDIAC CATHETERIZATION   PRE-ADMISSION INSTRUCTIONS    Patient Name: Alicia Holland  MRN#: [id number]  Date of Birth: [birth year] (71 y.o.)  Today's Date: 07/27/2021    PROCEDURE:  You are scheduled for a Coronary Angiogram with possible Angioplasty/Stenting with Dr. Harley Alto.    PROCEDURE DATE AND ARRIVAL TIME:  Your procedure date is 5/31.  You will receive a call from the Cath lab staff between 8:00 a.m. and noon on the business day prior to your procedure to let you know at what time to arrive on the day of your procedure.    Please check in at the Admitting Desk in the [facility] for your procedure. [facility] En[facility] and and take a right. Continue down the hallway past the Cardiovascular Medicine office. That hall will take you into the [facility]. Check in at the desk on the left side.)     (If you have further questions regarding your arrival time for the CV lab, please call [phone] by 3:00pm the day before your procedure. Please leave a message with your name and number, your call will be returned in a timely manner.)    PRE-PROCEDURE APPOINTMENTS:    5/18    Office visit to update history and physical (requirement within 30 days of procedure)  with  Dr. Harvel Ricks  at Cardiovascular Medicine  [facility]       5/29   Pre-Admission lab work required within 14 days of procedure: BMP and CBC at the lab of your choice.         FOOD AND DRINK INSTRUCTIONS  Nothing to eat after midnight before your procedure. No caffeine for 24 hours prior to your procedure. You will be under moderate sedation for your procedure.  You may drink clear liquids up to an hour before hospital arrival. This will be confirmed by the Cath lab staff the day before your procedure.     SPECIAL MEDICATION INSTRUCTIONS  Any new prescriptions will be sent to your pharmacy listed on file with [place].        Please either take 4 baby aspirins (4 times 81mg ) or one full strength NON-COATED 325mg  aspirin.        HOLD ALL erectile dysfunction medications for 3 days, unless prescribed for pulmonary hypertension.  HOLD ALL over the counter vitamins or supplements on the morning of your procedure.      Additional Instructions  If you wear CPAP, please bring your mask and machine with you to the hospital.    Take a bath or shower with anti-bacterial soap the evening before, or the morning of the procedure.     Bring photo ID and your health insurance card(s).    Arrange for a driver to take you home from the hospital. Please arrange for a friend or family member to take you home from this test. You cannot take a Taxi, [employer], or public transportation as there has to be a responsible person to help care for you after sedation    Bring an accurate list of your current medications with you to the hospital (all medications and supplements taken daily).  Please use the medication list below and write in the date and time when you took your last dose before your procedure. Update this list of medications as needed.      Wear comfortable clothes and don't bring valuables, other than photo identification card, with you to the hospital.    Please pack a bag for an overnight  stay.     Please review your pre-procedure instructions and bring them with you on the day of your procedure.  Call the office at  [phone]  with any questions. You may ask to speak with Dr. Carollee Herter Hoos-Thompson's nurse.        ALLERGIES  Allergies   Allergen Reactions    Atorvastatin MUSCLE PAIN       CURRENT MEDICATIONS  Outpatient Encounter Medications as of 07/27/2021   Medication Sig Dispense Refill    acetaminophen (TYLENOL EXTRA STRENGTH) 500 mg tablet Take one tablet by mouth every 6 hours as needed for Pain. Max of 4,000 mg of acetaminophen in 24 hours.      albuterol sulfate (PROAIR HFA) 90 mcg/actuation HFA aerosol inhaler Inhale two puffs by mouth into the lungs every 4-6 hours as needed for Wheezing or Shortness of Breath. Shake well before use.      aspirin EC 81 mg tablet Take one tablet by mouth daily. Take with food.      azelastine (ASTELIN) 137 mcg (0.1 %) nasal spray Apply two sprays to each nostril as directed twice daily as needed. Use in each nostril as directed      hydroCHLOROthiazide (HYDRODIURIL) 25 mg tablet Take one tablet by mouth daily.      losartan (COZAAR) 100 mg tablet Take one tablet by mouth daily.      polyethylene glycol 3350 (MIRALAX) 17 g packet Take one packet by mouth twice daily as needed.      pravastatin (PRAVACHOL) 80 mg tablet Take one tablet by mouth at bedtime daily.      QVAR REDIHALER 80 mcg/actuation inhaler Inhale one puff by mouth into the lungs daily.       No facility-administered encounter medications on file as of 07/27/2021.       _________________________________________  Form completed by: Rogelia Boga, RN  Date completed: 07/27/21  Method: Via telephone and mailed to the patient.

## 2021-07-27 NOTE — Telephone Encounter
Called and discussed results and recommendations.  Pt is agreeable to plan.  Scheduled pt for LHC on 5/31.

## 2021-07-27 NOTE — Progress Notes
Medicare is listed as patient's primary insurance coverage.  Pre-certification is not required for hospitalizations.

## 2021-07-27 NOTE — Telephone Encounter
-----   Message from Valora Piccolo, RN sent at 07/27/2021  2:16 PM CDT -----    ----- Message -----  From: Levora Angel, MD  Sent: 07/27/2021   2:12 PM CDT  To: C[facility] Nurse Gen Card Team [place]    Let her know her stress test did show changes that can represent poor blood supply to areas of her heart.  I would like to have her scheduled of coronary angiograms and possible intervention. Have her take 81 mg aspirin daily.  thanks  ----- Message -----  From: Levora Angel, MD  Sent: 07/27/2021   2:10 PM CDT  To: Levora Angel, MD

## 2021-07-28 ENCOUNTER — Encounter: Admit: 2021-07-28 | Discharge: 2021-07-28 | Payer: MEDICARE

## 2021-07-28 DIAGNOSIS — I10 Essential (primary) hypertension: Secondary | ICD-10-CM

## 2021-07-28 LAB — BASIC METABOLIC PANEL
ANION GAP: 8
BLD UREA NITROGEN: 15 — ABNORMAL LOW (ref 32.2–35.5)
CALCIUM: 9.6
CHLORIDE: 105
CO2: 26
CREATININE: 0.7
GLUCOSE,PANEL: 114 — ABNORMAL HIGH (ref 70–105)
POTASSIUM: 3.6
SODIUM: 139

## 2021-07-28 LAB — CBC
RBC COUNT: 4.3
WBC COUNT: 7.1

## 2021-08-04 ENCOUNTER — Encounter: Admit: 2021-08-04 | Discharge: 2021-08-04 | Payer: MEDICARE

## 2021-08-04 DIAGNOSIS — I10 Essential (primary) hypertension: Secondary | ICD-10-CM

## 2021-08-04 DIAGNOSIS — G459 Transient cerebral ischemic attack, unspecified: Secondary | ICD-10-CM

## 2021-08-04 DIAGNOSIS — J449 Chronic obstructive pulmonary disease, unspecified: Secondary | ICD-10-CM

## 2021-08-04 DIAGNOSIS — E785 Hyperlipidemia, unspecified: Secondary | ICD-10-CM

## 2021-08-04 DIAGNOSIS — K219 Gastro-esophageal reflux disease without esophagitis: Secondary | ICD-10-CM

## 2021-08-04 MED ADMIN — ASPIRIN 325 MG PO TAB [681]: 325 mg | ORAL | @ 13:00:00 | Stop: 2021-08-04 | NDC 00536105405

## 2021-08-04 MED ADMIN — SODIUM CHLORIDE 0.9 % IV SOLP [27838]: 500 mL | INTRAVENOUS | @ 13:00:00 | Stop: 2021-08-04 | NDC 00338004904

## 2021-09-23 IMAGING — MG MM mammogram 3D screen bilat
8 series · 8 of 24 positions shown · non-contrast
Comparison: none

[R CC]
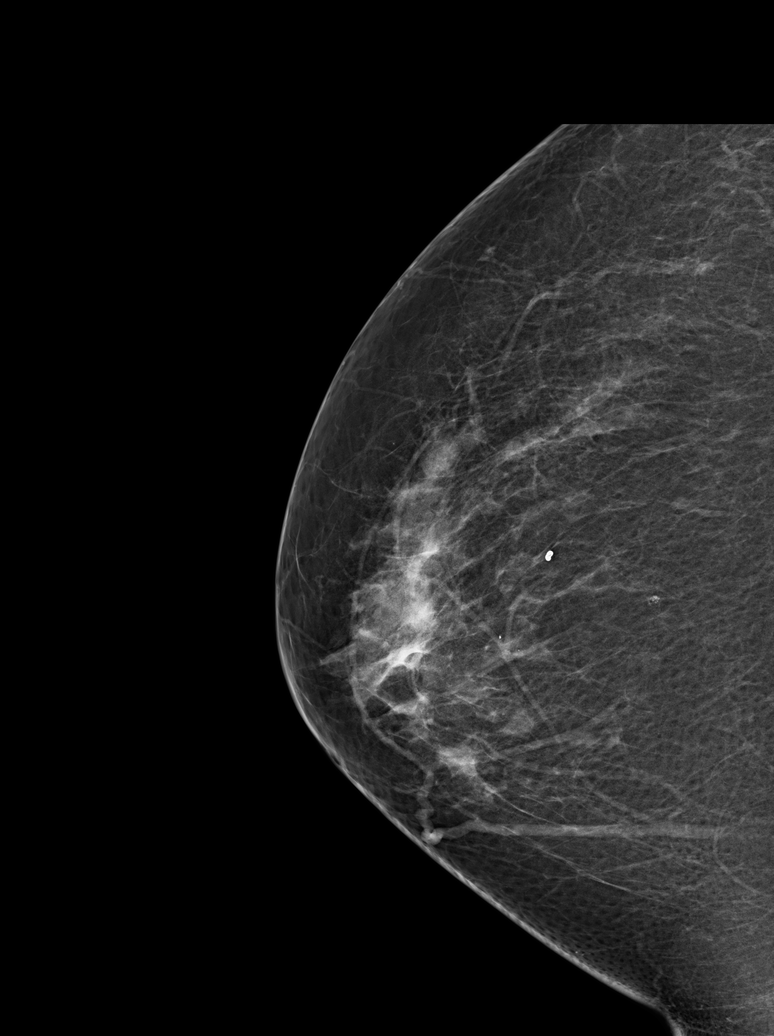

[BTO_TOMO R-CC PRIME, EMPIRE_C.97/112, Sc tomo · tomo slice 33/64.0]
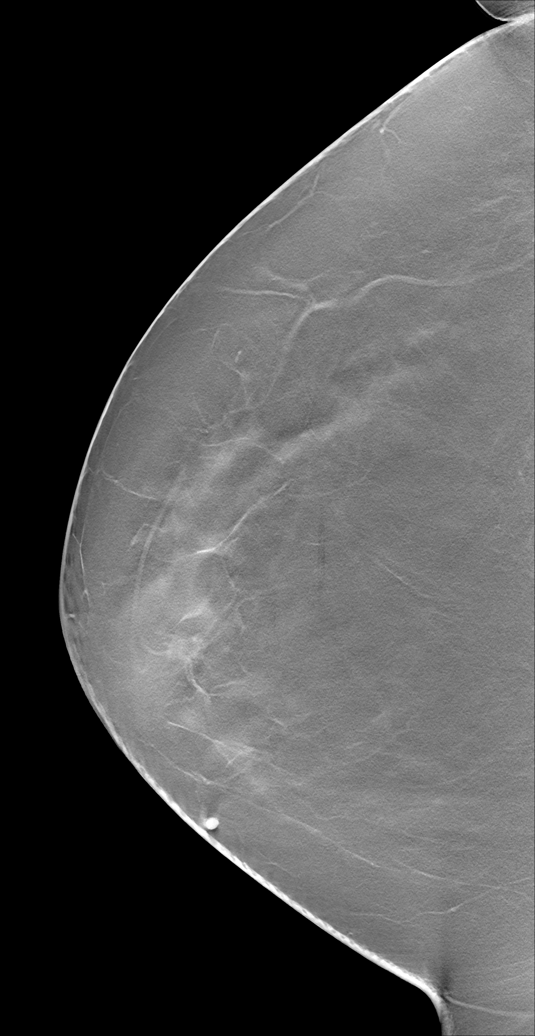

[L CC]
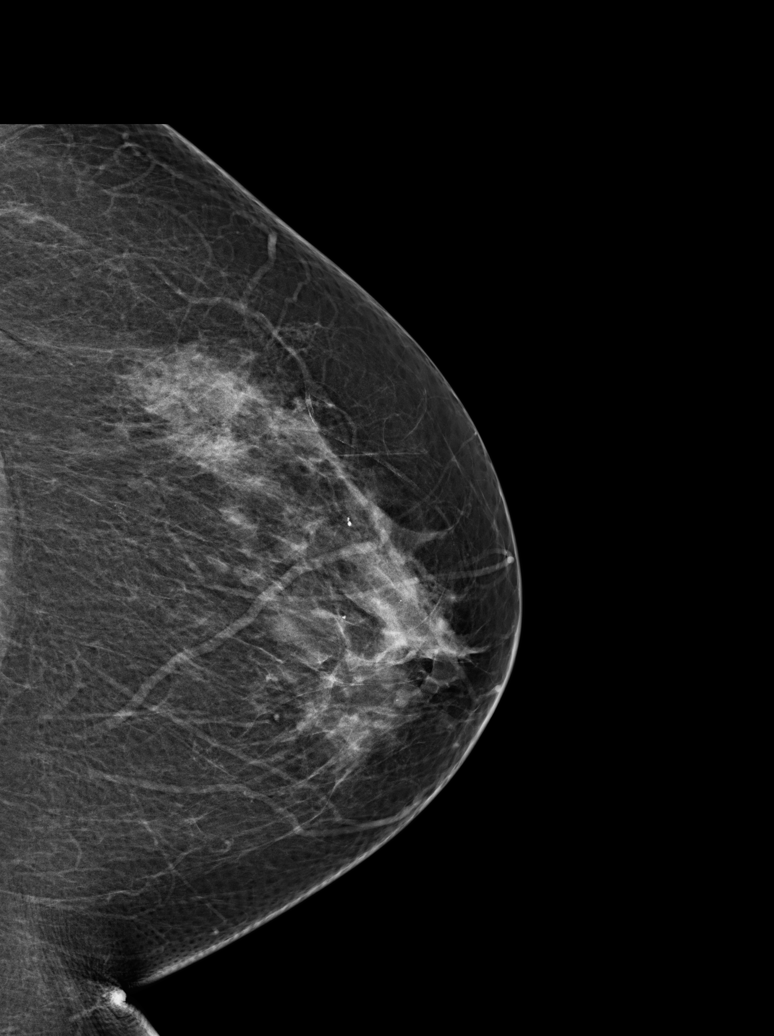

[BTO_TOMO L-CC PRIME, EMPIRE_C.97/112, Sc tomo · tomo slice 35/69.0]
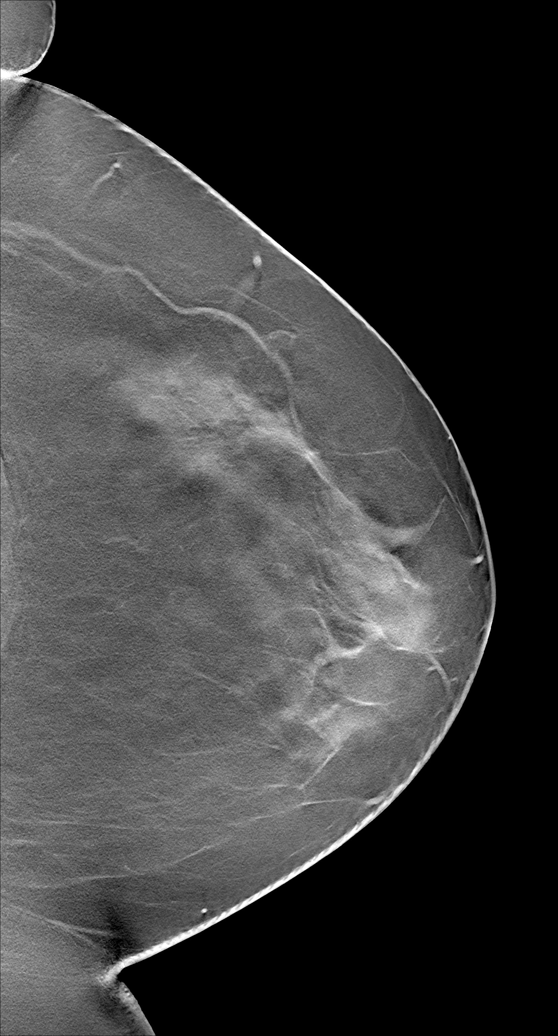

[R MLO]
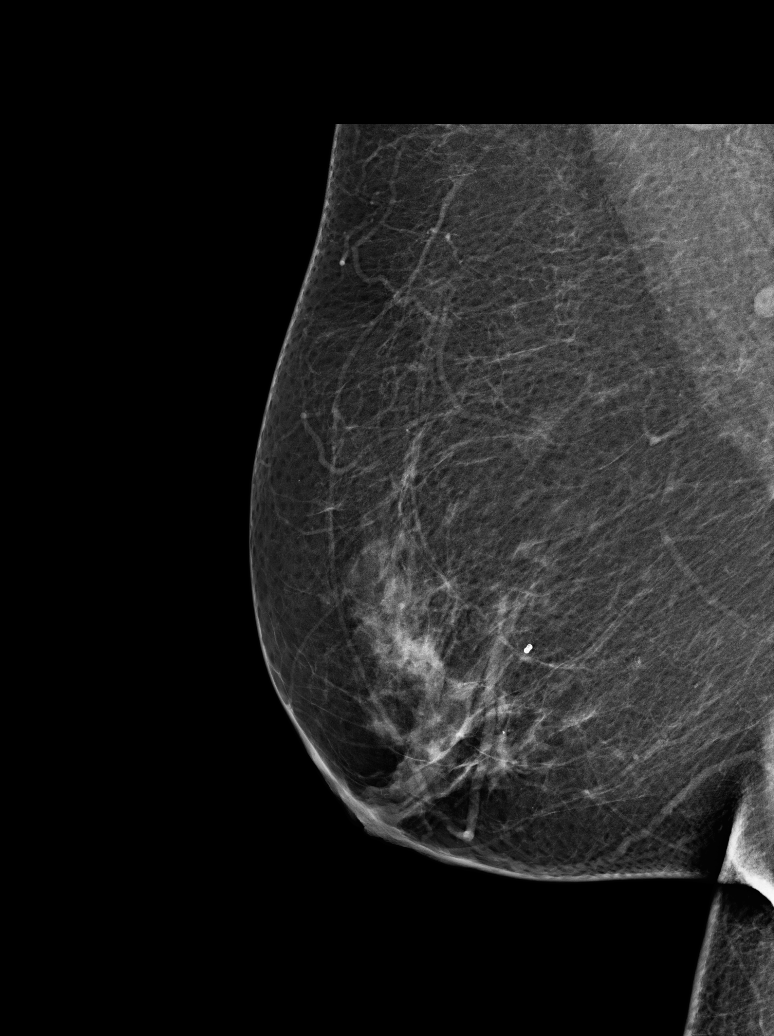

[BTO_TOMO R-MLO PRIME, EMPIRE_C.97/112, S tomo · tomo slice 37/73.0]
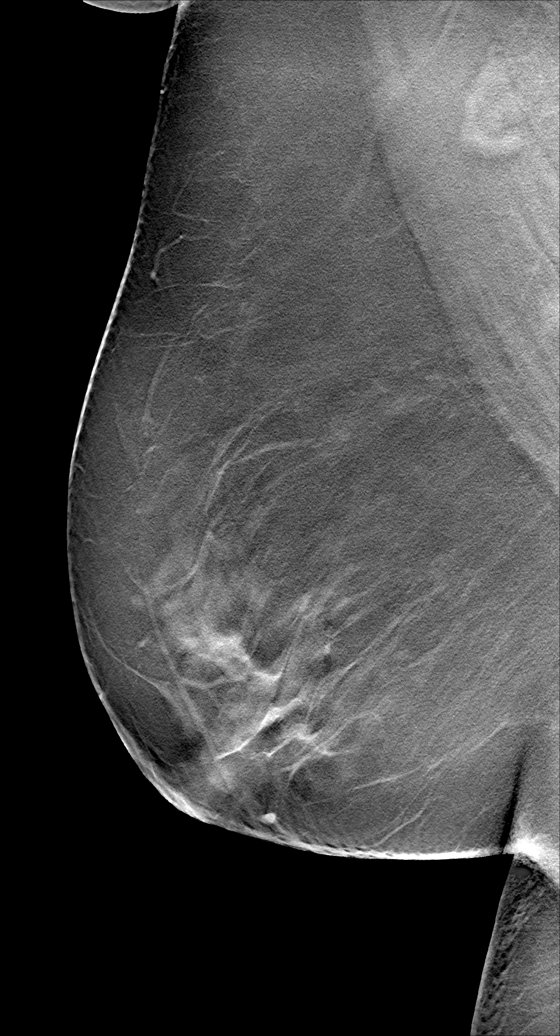

[L MLO]
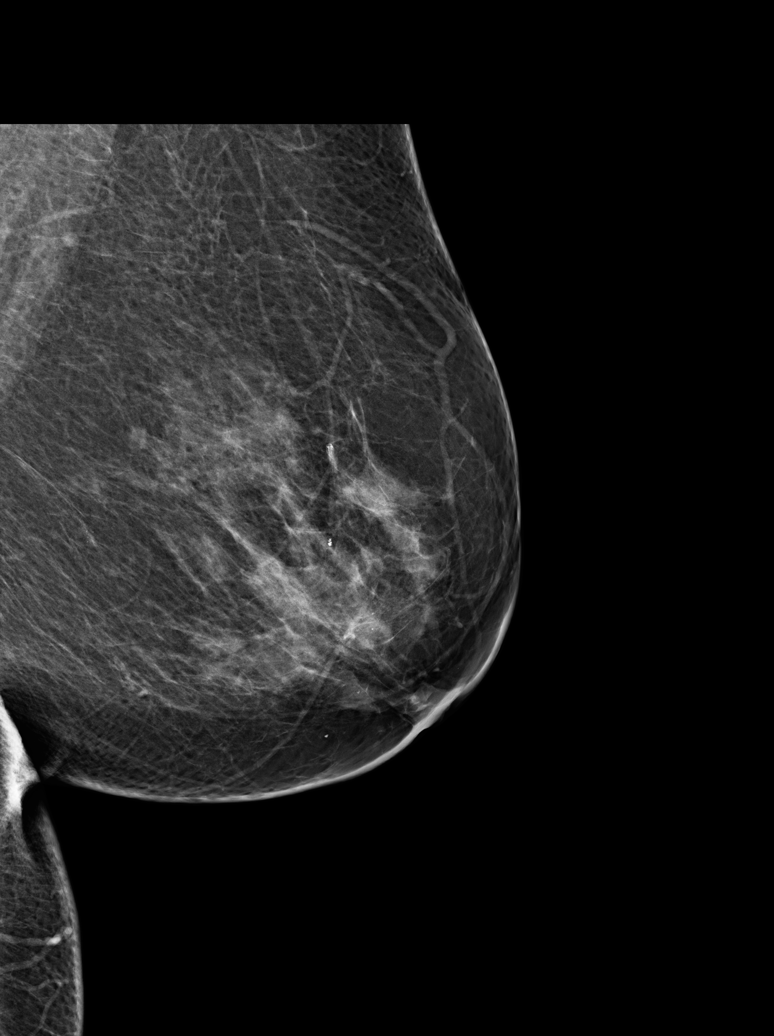

[BTO_TOMO L-MLO PRIME, EMPIRE_C.97/112, S tomo · tomo slice 37/72.0]
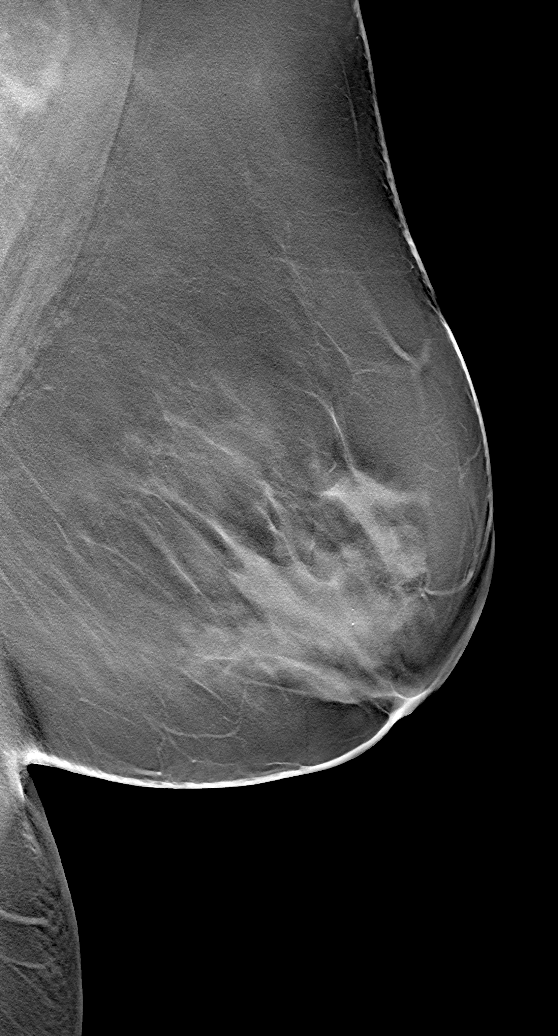

[8 of 24 positions shown; findings below may reference images not displayed]

EXAM

3D SCREENING MAMMOGRAM, BILATERAL

INDICATION

screen
2 MATERNAL AUNTS DX BREAST CA @ 40 AN 70.  TC SCORE:  LIFETIME RISK=  5.7%.  SCREENING.  AB (3D)
PRIORS: [id number].

TECHNIQUE

Digital 2D CC and MLO projections obtained with 3D tomographic views per manufacturer's protocol.

COMPARISONS

August 26, 2020

FINDINGS

ACR Type 2:  25-50% There are scattered fibroglandular densities.

Scattered unchanged calcifications are seen. No concerning masses or calcifications are evident.

IMPRESSION

Stable mammography.

BI-RADS 2, BENIGN.

Tech Notes:

## 2022-01-17 ENCOUNTER — Encounter: Admit: 2022-01-17 | Discharge: 2022-01-17 | Payer: MEDICARE

## 2022-01-18 ENCOUNTER — Encounter: Admit: 2022-01-18 | Discharge: 2022-01-18 | Payer: MEDICARE

## 2022-01-18 DIAGNOSIS — G459 Transient cerebral ischemic attack, unspecified: Secondary | ICD-10-CM

## 2022-01-18 DIAGNOSIS — R9439 Abnormal result of other cardiovascular function study: Secondary | ICD-10-CM

## 2022-01-18 DIAGNOSIS — E785 Hyperlipidemia, unspecified: Secondary | ICD-10-CM

## 2022-01-18 DIAGNOSIS — J449 Chronic obstructive pulmonary disease, unspecified: Secondary | ICD-10-CM

## 2022-01-18 DIAGNOSIS — K219 Gastro-esophageal reflux disease without esophagitis: Secondary | ICD-10-CM

## 2022-01-18 DIAGNOSIS — I10 Essential (primary) hypertension: Secondary | ICD-10-CM

## 2022-01-18 NOTE — Patient Instructions
May stop the Zetia.  Continue other medications.  Heart and vascular findings otherwise are doing very well.  Follow-up could be if needed  Happy Thanksgiving

## 2022-01-18 NOTE — Progress Notes
Date of Service: 01/18/2022    Alicia Holland is a 71 y.o. female.       HPI     Patient is a 71 year old Caucasian female past medical history of hypertension hyperlipidemia who is seen for chest discomfort that was more problematic with activity and stress.  She had an elevated coronary artery calcium score.  Because of her symptoms and risk factors I did have her undergo cardiac stress assessment was mildly abnormal and put her at intermediate risk for cardiovascular events.  She underwent coronary angiograms that showed nonobstructive disease and normal flow patterns.  She has been in medical therapy with good control and tolerance.  Reports the discomfort she was having her chest is essentially resolved.  Has been identified to have nocturnal hypoxia and is use intermittent oxygen at night.         Vitals:    01/18/22 1343   BP: 138/70   BP Source: Arm, Left Upper   Pulse: 68   SpO2: 99%   O2 Device: None (Room air)   PainSc: Zero   Weight: 76.1 kg (167 lb 12.8 oz)   Height: 158.8 cm (5' 2.5)     Body mass index is 30.2 kg/m?Marland Kitchen     Past Medical History  Patient Active Problem List    Diagnosis Date Noted   ? Abnormal cardiovascular stress test 08/04/2021   ? Hyperlipidemia 08/21/2020   ? COPD (chronic obstructive pulmonary disease) (HCC) 08/21/2020   ? Primary hypertension 08/21/2020   ? GERD (gastroesophageal reflux disease) 08/21/2020   ? CVA (cerebral vascular accident) (HCC) 08/21/2020     07/30/20 Acute lacunar stroke within lt and rt occipital lobe.           Review of Systems   Constitutional: Negative.   HENT: Negative.    Eyes: Negative.    Cardiovascular: Negative.    Respiratory: Negative.    Endocrine: Negative.    Hematologic/Lymphatic: Negative.    Skin: Negative.    Musculoskeletal: Negative.    Gastrointestinal: Negative.    Genitourinary: Negative.    Neurological: Negative.    Psychiatric/Behavioral: Negative.    Allergic/Immunologic: Negative.        Physical Exam  Awake and alert, well-nourished female who appears given age  Pupils are equal react without scleral injection  Neck is supple normal, upstroke and no bruits, no masses, normal jugular venous findings  Heart S1, S2 that are normal.  No murmurs, clicks, or gallops  Chest is symmetric and lungs are clear to auscultation  Abdomen is mildly obese but otherwise soft  Pulse are 2+, regular, and symmetric bilaterally radial as well as pedal locations  No peripheral edema and symmetric muscle tone and good skin turgor    Cardiovascular Studies      Cardiovascular Health Factors  Vitals BP Readings from Last 3 Encounters:   01/18/22 138/70   08/04/21 (!) 149/64   07/22/21 138/68     Wt Readings from Last 3 Encounters:   01/18/22 76.1 kg (167 lb 12.8 oz)   08/04/21 76.1 kg (167 lb 12.3 oz)   07/22/21 76.7 kg (169 lb 3.2 oz)     BMI Readings from Last 3 Encounters:   01/18/22 30.20 kg/m?   08/04/21 30.20 kg/m?   07/22/21 30.95 kg/m?      Smoking Social History     Tobacco Use   Smoking Status Former   ? Types: Cigarettes   ? Quit date: 08/25/1996   ? Years since  quitting: 25.4   Smokeless Tobacco Never      Lipid Profile Cholesterol   Date Value Ref Range Status   08/04/2021 126 <200 MG/DL Final     HDL   Date Value Ref Range Status   08/04/2021 54 >40 MG/DL Final     LDL   Date Value Ref Range Status   08/04/2021 61 <100 mg/dL Final     Triglycerides   Date Value Ref Range Status   08/04/2021 71 <150 MG/DL Final      Blood Sugar No results found for: HGBA1C  Glucose   Date Value Ref Range Status   07/28/2021 114 (H) 70 - 105 Final   07/31/2020 85  Final   07/30/2020 103  Final          Problems Addressed Today  Encounter Diagnoses   Name Primary?   ? Abnormal stress ECG Yes   ? Hyperlipidemia, unspecified hyperlipidemia type    ? Primary hypertension        Assessment and Plan     No findings for significant heart or vascular abnormalities.  Risk factors for cardiovascular disease are well addressed.  Did discu medical therapy and treatment guidelines.  At this time he stopped the Zetia.  Did discuss that if tolerated would continue on the low-dose aspirin, she is someone that if she is having any GI symptoms would advise her to stop her aspirin.  With the identified hypoxia at night would advise continuing nocturnal oxygen for support.  Follow-up could be if needed.         Current Medications (including today's revisions)  ? acetaminophen (TYLENOL EXTRA STRENGTH) 500 mg tablet Take one tablet by mouth every 6 hours as needed for Pain. Max of 4,000 mg of acetaminophen in 24 hours.   ? albuterol sulfate (PROAIR HFA) 90 mcg/actuation HFA aerosol inhaler Inhale two puffs by mouth into the lungs every 4-6 hours as needed for Wheezing or Shortness of Breath. Shake well before use.   ? aspirin EC 81 mg tablet Take one tablet by mouth daily. Take with food.   ? azelastine (ASTELIN) 137 mcg (0.1 %) nasal spray Apply two sprays to each nostril as directed twice daily as needed. Use in each nostril as directed   ? CHOLEcalciferoL (vitamin D3) (OPTIMAL D3) 50,000 units capsule Take one capsule by mouth every 7 days.   ? hydroCHLOROthiazide (HYDRODIURIL) 25 mg tablet Take one tablet by mouth daily.   ? losartan (COZAAR) 100 mg tablet Take one tablet by mouth daily.   ? polyethylene glycol 3350 (MIRALAX) 17 g packet Take one packet by mouth twice daily as needed.   ? pravastatin (PRAVACHOL) 80 mg tablet Take one tablet by mouth at bedtime daily.   ? QVAR REDIHALER 80 mcg/actuation inhaler Inhale one puff by mouth into the lungs daily.

## 2022-06-13 ENCOUNTER — Encounter: Admit: 2022-06-13 | Discharge: 2022-06-13 | Payer: MEDICARE

## 2022-06-13 MED ORDER — FUROSEMIDE 40 MG PO TAB
40 mg | ORAL_TABLET | Freq: Every morning | ORAL | 0 refills | 90.00000 days | Status: AC
Start: 2022-06-13 — End: ?

## 2022-06-13 NOTE — Telephone Encounter
Alicia Holland, Alicia Homer, MD  Rogelia Boga, RN  Caller: Unspecified (Today, 10:35 AM)  Have her take Lasix 40 mg daily for 3 days and back to her basal medicines.  Call me if she has not improved after those 3 days            Called and discussed recommendations with patient.  She is agreeable to plan. Script sent to pharmacy.  Will callback with any questions, concerns or problems.

## 2022-06-13 NOTE — Telephone Encounter
Ahsaki called into the nurse line to report that for the past 2-3 weeks, she has noticed swelling her ankles and lower legs.  She states that it started in the right and now both legs are swelling, the right a little more so then the left.  She states that it is really not a lot of swelling, but is concerned because it does not go away.  She states that she has tried elevating her legs and worn compression socks, and while they both help, it never really goes away completely.    Carnisha states that her weight is up about 3-4 pounds.  She states that it has been "creeping up" and is not sure how long that has been ongoing, but does not feel like it is just over the last few weeks.  She denies any additional swelling, no shortness of breath and no chest pain.  She states she has been feeling a little more fatigued, but nothing too major.    She has not had any changes to her diet and tries to limit her sodium intake.  She is taking her medications as prescribed, including HCTZ 25mg  daily.  She does not check her BP/HR at home regularly.    Will route to [facility] for review and recommendations.
